# Patient Record
Sex: Female | Born: 1975 | Hispanic: Yes | Smoking: Never smoker
Health system: Southern US, Community
[De-identification: ages and names within clinical notes are randomized; demographics above are authoritative.]

## PROBLEM LIST (undated history)

## (undated) DIAGNOSIS — Z9882 Breast implant status: Secondary | ICD-10-CM

## (undated) DIAGNOSIS — D219 Benign neoplasm of connective and other soft tissue, unspecified: Secondary | ICD-10-CM

## (undated) DIAGNOSIS — K2970 Gastritis, unspecified, without bleeding: Secondary | ICD-10-CM

## (undated) DIAGNOSIS — J45909 Unspecified asthma, uncomplicated: Secondary | ICD-10-CM

## (undated) DIAGNOSIS — G43909 Migraine, unspecified, not intractable, without status migrainosus: Secondary | ICD-10-CM

## (undated) HISTORY — DX: Unspecified asthma, uncomplicated: J45.909

---

## 2010-01-16 HISTORY — PX: VAGINOPLASTY: SHX329

## 2012-01-17 DIAGNOSIS — J45909 Unspecified asthma, uncomplicated: Secondary | ICD-10-CM

## 2012-01-17 HISTORY — DX: Unspecified asthma, uncomplicated: J45.909

## 2014-12-24 ENCOUNTER — Other Ambulatory Visit: Payer: Self-pay

## 2014-12-24 DIAGNOSIS — Z1231 Encounter for screening mammogram for malignant neoplasm of breast: Secondary | ICD-10-CM

## 2015-01-15 ENCOUNTER — Ambulatory Visit: Payer: Self-pay

## 2015-01-20 ENCOUNTER — Ambulatory Visit: Payer: Self-pay

## 2015-01-25 ENCOUNTER — Ambulatory Visit: Payer: Self-pay

## 2015-10-13 ENCOUNTER — Telehealth: Payer: Self-pay | Admitting: *Deleted

## 2015-10-13 NOTE — Telephone Encounter (Signed)
Generic Allese called to [phone].336 [phone].Patient has an appointment to be seen on 11/12/15.

## 2015-11-10 ENCOUNTER — Ambulatory Visit (INDEPENDENT_AMBULATORY_CARE_PROVIDER_SITE_OTHER): Payer: Medicaid Other | Admitting: Obstetrics and Gynecology

## 2015-11-10 ENCOUNTER — Encounter: Payer: Self-pay | Admitting: Obstetrics and Gynecology

## 2015-11-10 DIAGNOSIS — Z30014 Encounter for initial prescription of intrauterine contraceptive device: Secondary | ICD-10-CM | POA: Diagnosis not present

## 2015-11-10 DIAGNOSIS — Z3202 Encounter for pregnancy test, result negative: Secondary | ICD-10-CM | POA: Diagnosis not present

## 2015-11-10 LAB — POCT URINE PREGNANCY: Preg Test, Ur: NEGATIVE

## 2015-11-10 MED ORDER — LEVONORGESTREL 20 MCG/24HR IU IUD
INTRAUTERINE_SYSTEM | Freq: Once | INTRAUTERINE | Status: AC
  Administered 2015-11-10: 15:00:00 via INTRAUTERINE

## 2015-11-10 NOTE — Addendum Note (Signed)
Addended by: Tristan Schroeder D on: 11/10/2015 04:54 PM   Modules accepted: Orders

## 2015-11-10 NOTE — Patient Instructions (Signed)
Levonorgestrel intrauterine device (IUD) What is this medicine? LEVONORGESTREL IUD (LEE voe nor jes trel) is a contraceptive (birth control) device. The device is placed inside the uterus by a healthcare professional. It is used to prevent pregnancy and can also be used to treat heavy bleeding that occurs during your period. Depending on the device, it can be used for 3 to 5 years. This medicine may be used for other purposes; ask your health care provider or pharmacist if you have questions. What should I tell my health care provider before I take this medicine? They need to know if you have any of these conditions: -abnormal Pap smear -cancer of the breast, uterus, or cervix -diabetes -endometritis -genital or pelvic infection now or in the past -have more than one sexual partner or your partner has more than one partner -heart disease -history of an ectopic or tubal pregnancy -immune system problems -IUD in place -liver disease or tumor -problems with blood clots or take blood-thinners -use intravenous drugs -uterus of unusual shape -vaginal bleeding that has not been explained -an unusual or allergic reaction to levonorgestrel, other hormones, silicone, or polyethylene, medicines, foods, dyes, or preservatives -pregnant or trying to get pregnant -breast-feeding How should I use this medicine? This device is placed inside the uterus by a health care professional. Talk to your pediatrician regarding the use of this medicine in children. Special care may be needed. Overdosage: If you think you have taken too much of this medicine contact a poison control center or emergency room at once. NOTE: This medicine is only for you. Do not share this medicine with others. What if I miss a dose? This does not apply. What may interact with this medicine? Do not take this medicine with any of the following medications: -amprenavir -bosentan -fosamprenavir This medicine may also interact with  the following medications: -aprepitant -barbiturate medicines for inducing sleep or treating seizures -bexarotene -griseofulvin -medicines to treat seizures like carbamazepine, ethotoin, felbamate, oxcarbazepine, phenytoin, topiramate -modafinil -pioglitazone -rifabutin -rifampin -rifapentine -some medicines to treat HIV infection like atazanavir, indinavir, lopinavir, nelfinavir, tipranavir, ritonavir -St. John's wort -warfarin This list may not describe all possible interactions. Give your health care provider a list of all the medicines, herbs, non-prescription drugs, or dietary supplements you use. Also tell them if you smoke, drink alcohol, or use illegal drugs. Some items may interact with your medicine. What should I watch for while using this medicine? Visit your doctor or health care professional for regular check ups. See your doctor if you or your partner has sexual contact with others, becomes HIV positive, or gets a sexual transmitted disease. This product does not protect you against HIV infection (AIDS) or other sexually transmitted diseases. You can check the placement of the IUD yourself by reaching up to the top of your vagina with clean fingers to feel the threads. Do not pull on the threads. It is a good habit to check placement after each menstrual period. Call your doctor right away if you feel more of the IUD than just the threads or if you cannot feel the threads at all. The IUD may come out by itself. You may become pregnant if the device comes out. If you notice that the IUD has come out use a backup birth control method like condoms and call your health care provider. Using tampons will not change the position of the IUD and are okay to use during your period. What side effects may I notice from receiving this medicine?   Side effects that you should report to your doctor or health care professional as soon as possible: -allergic reactions like skin rash, itching or  hives, swelling of the face, lips, or tongue -fever, flu-like symptoms -genital sores -high blood pressure -no menstrual period for 6 weeks during use -pain, swelling, warmth in the leg -pelvic pain or tenderness -severe or sudden headache -signs of pregnancy -stomach cramping -sudden shortness of breath -trouble with balance, talking, or walking -unusual vaginal bleeding, discharge -yellowing of the eyes or skin Side effects that usually do not require medical attention (report to your doctor or health care professional if they continue or are bothersome): -acne -breast pain -change in sex drive or performance -changes in weight -cramping, dizziness, or faintness while the device is being inserted -headache -irregular menstrual bleeding within first 3 to 6 months of use -nausea This list may not describe all possible side effects. Call your doctor for medical advice about side effects. You may report side effects to FDA at 1-800-FDA-1088. Where should I keep my medicine? This does not apply. NOTE: This sheet is a summary. It may not cover all possible information. If you have questions about this medicine, talk to your doctor, pharmacist, or health care provider.    2016, Elsevier/Gold Standard. (2011-02-02 13:54:04) IUD PLACEMENT POST-PROCEDURE INSTRUCTIONS  1. You may take Ibuprofen, Aleve or Tylenol for pain if needed.  Cramping should resolve within in 24 hours.  2. You may have a small amount of spotting.  You should wear a mini pad for the next few days.  3. You may have intercourse after 24 hours.  If you using this for birth control, it is effective immediately.  4. You need to call if you have any pelvic pain, fever, heavy bleeding or foul smelling vaginal discharge.  Irregular bleeding is common the first several months after having an IUD placed. You do not need to call for this reason unless you are concerned.  5. Shower or bathe as normal  6. You should have a  follow-up appointment in 4-8 weeks for a re-check to make sure you are not having any problems.

## 2015-11-10 NOTE — Progress Notes (Addendum)
Pt here for IUD insertion   IUD Insertion Procedure Note  Pre-operative Diagnosis: Contraception  Post-operative Diagnosis: same  Indications: contraception  Procedure Details  Urine pregnancy test was done today and result was negative.  The risks (including infection, bleeding, pain, and uterine perforation) and benefits of the procedure were explained to the patient and written and verbal informed consent was obtained.    Cervix cleansed with Betadine. Uterus sounded to 8 cm. IUD inserted without difficulty. String visible and trimmed. 3 cm form cervix. Patient tolerated procedure well.  IUD Information: Mirena.  Condition: Stable  Complications: None  Plan:  The patient was advised to call for any fever or for prolonged or severe pain or bleeding. She was advised to use OTC analgesics as needed for mild to moderate pain.

## 2015-11-22 ENCOUNTER — Encounter: Payer: Self-pay | Admitting: Obstetrics and Gynecology

## 2015-11-29 ENCOUNTER — Emergency Department (HOSPITAL_COMMUNITY)
Admission: EM | Admit: 2015-11-29 | Discharge: 2015-11-29 | Disposition: A | Discharge status: Home / Self Care | Payer: Medicaid Other | Attending: Emergency Medicine | Admitting: Emergency Medicine

## 2015-11-29 ENCOUNTER — Encounter (HOSPITAL_COMMUNITY): Payer: Self-pay | Admitting: Emergency Medicine

## 2015-11-29 ENCOUNTER — Emergency Department (HOSPITAL_COMMUNITY): Payer: Medicaid Other

## 2015-11-29 DIAGNOSIS — Z79899 Other long term (current) drug therapy: Secondary | ICD-10-CM | POA: Diagnosis not present

## 2015-11-29 DIAGNOSIS — N83201 Unspecified ovarian cyst, right side: Secondary | ICD-10-CM | POA: Diagnosis not present

## 2015-11-29 DIAGNOSIS — R102 Pelvic and perineal pain: Secondary | ICD-10-CM

## 2015-11-29 DIAGNOSIS — J45909 Unspecified asthma, uncomplicated: Secondary | ICD-10-CM | POA: Insufficient documentation

## 2015-11-29 DIAGNOSIS — T8332XA Displacement of intrauterine contraceptive device, initial encounter: Secondary | ICD-10-CM

## 2015-11-29 DIAGNOSIS — R1030 Lower abdominal pain, unspecified: Secondary | ICD-10-CM | POA: Diagnosis present

## 2015-11-29 DIAGNOSIS — T8389XA Other specified complication of genitourinary prosthetic devices, implants and grafts, initial encounter: Secondary | ICD-10-CM

## 2015-11-29 LAB — CBC WITH DIFFERENTIAL/PLATELET
BASOS ABS: 0 10*3/uL (ref 0.0–0.1)
Basophils Relative: 1 %
Eosinophils Absolute: 0.1 10*3/uL (ref 0.0–0.7)
Eosinophils Relative: 1 %
HEMATOCRIT: 39.6 % (ref 36.0–46.0)
HEMOGLOBIN: 13.3 g/dL (ref 12.0–15.0)
LYMPHS PCT: 30 %
Lymphs Abs: 2.3 10*3/uL (ref 0.7–4.0)
MCH: 28.5 pg (ref 26.0–34.0)
MCHC: 33.6 g/dL (ref 30.0–36.0)
MCV: 84.8 fL (ref 78.0–100.0)
Monocytes Absolute: 0.4 10*3/uL (ref 0.1–1.0)
Monocytes Relative: 5 %
NEUTROS ABS: 4.9 10*3/uL (ref 1.7–7.7)
NEUTROS PCT: 63 %
Platelets: 195 10*3/uL (ref 150–400)
RBC: 4.67 MIL/uL (ref 3.87–5.11)
RDW: 12.5 % (ref 11.5–15.5)
WBC: 7.6 10*3/uL (ref 4.0–10.5)

## 2015-11-29 LAB — URINALYSIS, ROUTINE W REFLEX MICROSCOPIC
Bilirubin Urine: NEGATIVE
Glucose, UA: NEGATIVE mg/dL
Hgb urine dipstick: NEGATIVE
Ketones, ur: NEGATIVE mg/dL
LEUKOCYTES UA: NEGATIVE
NITRITE: NEGATIVE
PH: 7.5 (ref 5.0–8.0)
Protein, ur: NEGATIVE mg/dL
SPECIFIC GRAVITY, URINE: 1.007 (ref 1.005–1.030)

## 2015-11-29 LAB — COMPREHENSIVE METABOLIC PANEL
ALT: 15 U/L (ref 14–54)
AST: 18 U/L (ref 15–41)
Albumin: 3.7 g/dL (ref 3.5–5.0)
Alkaline Phosphatase: 45 U/L (ref 38–126)
Anion gap: 6 (ref 5–15)
BUN: 8 mg/dL (ref 6–20)
CHLORIDE: 103 mmol/L (ref 101–111)
CO2: 27 mmol/L (ref 22–32)
CREATININE: 0.82 mg/dL (ref 0.44–1.00)
Calcium: 9 mg/dL (ref 8.9–10.3)
GFR calc non Af Amer: >60 mL/min (ref >60)
Glucose, Bld: 94 mg/dL (ref 65–99)
POTASSIUM: 4.1 mmol/L (ref 3.5–5.1)
SODIUM: 136 mmol/L (ref 135–145)
Total Bilirubin: 0.6 mg/dL (ref 0.3–1.2)
Total Protein: 6.5 g/dL (ref 6.5–8.1)

## 2015-11-29 LAB — WET PREP, GENITAL
CLUE CELLS WET PREP: NONE SEEN
SPERM: NONE SEEN
TRICH WET PREP: NONE SEEN
YEAST WET PREP: NONE SEEN

## 2015-11-29 LAB — I-STAT BETA HCG BLOOD, ED (MC, WL, AP ONLY): I-stat hCG, quantitative: <5 m[IU]/mL (ref <5)

## 2015-11-29 LAB — LIPASE, BLOOD: Lipase: 30 U/L (ref 11–51)

## 2015-11-29 MED ORDER — ONDANSETRON HCL 4 MG/2ML IJ SOLN
4.0000 mg | Freq: Once | INTRAMUSCULAR | Status: DC
  Filled 2015-11-29: qty 2

## 2015-11-29 MED ORDER — MORPHINE SULFATE (PF) 4 MG/ML IV SOLN
4.0000 mg | Freq: Once | INTRAVENOUS | Status: AC
  Administered 2015-11-29: 4 mg via INTRAVENOUS
  Filled 2015-11-29: qty 1

## 2015-11-29 MED ORDER — HYDROCODONE-ACETAMINOPHEN 5-325 MG PO TABS: ORAL_TABLET | ORAL | 0 refills | Status: DC

## 2015-11-29 NOTE — ED Triage Notes (Signed)
Pt sts lower abd pain that woke here from sleep; pt sts had IUD placed 1 month ago

## 2015-11-29 NOTE — ED Provider Notes (Signed)
**Note Estrada-Identified via Obfuscation** [facility] DEPT Provider Note   CSN: [id number] Arrival date & time: 11/29/15  1018     History   Chief Complaint Chief Complaint  Patient presents with  . Abdominal Pain    HPI  Blood pressure 117/86, pulse 86, temperature 98.1 F (36.7 C), temperature source Oral, resp. rate 20, height 5\' 5"  (1.651 m), weight 55.3 kg, last menstrual period 11/10/2015, SpO2 100 %.  Catherine Estrada is a 40 y.o. female complaining of acute onset of midline lower abdominal pain at 9 AM, woke her from sleep, 10 out of 10, she took 2 acetaminophen with little relief, associated nausea with no vomiting, dysuria, hematuria, change in vaginal discharge or defecation. She had IUD placed on October 25 at [facility], has [facility] appointment on November 20.  Past Medical History:  Diagnosis Date  . Asthma 2014    Patient Active Problem List   Diagnosis Date Noted  . Encounter for initial prescription of intrauterine contraceptive device (IUD) 11/10/2015    History reviewed. No pertinent surgical history.  OB History    No data available       Home Medications    Prior to Admission medications   Medication Sig Start Date End Date Taking? Authorizing Provider  albuterol (PROVENTIL HFA;VENTOLIN HFA) 108 (90 Base) MCG/ACT inhaler Inhale 2 puffs into the lungs every 6 (six) hours as needed for wheezing or shortness of breath.   Yes Historical Provider, MD  HYDROcodone-acetaminophen (NORCO/VICODIN) 5-325 MG tablet Take 1-2 tablets by mouth every 6 hours as needed for pain and/or cough. 11/29/15   Davon Folta, PA-C  Levonorgestrel-Ethinyl Estrad (ALESSE, 28, PO) Take by mouth.    Historical Provider, MD    Family History Family History  Problem Relation Age of Onset  . Breast cancer Paternal Aunt     Social History Social History  Substance Use Topics  . Smoking status: Never Smoker  . Smokeless tobacco: Never Used  . Alcohol use No     Allergies   Patient has no known  allergies.   Review of Systems Review of Systems  10 systems reviewed and found to be negative, except as noted in the HPI.   Physical Exam Updated Vital Signs BP 109/79 (BP Location: Right Arm)   Pulse 85   Temp 98 F (36.7 C) (Oral)   Resp 16   Ht 5\' 5"  (1.651 m)   Wt 55.3 kg   LMP 11/10/2015 (Exact Date)   SpO2 100%   BMI 20.30 kg/m   Physical Exam  Constitutional: She is oriented to person, place, and time. She appears well-developed and well-nourished. No distress.  HENT:  Head: Normocephalic and atraumatic.  Mouth/Throat: Oropharynx is clear and moist.  Eyes: Conjunctivae and EOM are normal. Pupils are equal, round, and reactive to light.  Neck: Normal range of motion.  Cardiovascular: Normal rate, regular rhythm and intact distal pulses.   Pulmonary/Chest: Effort normal and breath sounds normal.  Abdominal: Soft. There is no tenderness.  Genitourinary:  Genitourinary Comments: Pelvic exam is chaperoned by nurse: No rashes or lesions, scant, white, non-foul-smelling vaginal discharge. Cervix with no strings visualized, no cervical motion or adnexal tenderness.  Musculoskeletal: Normal range of motion.  Neurological: She is alert and oriented to person, place, and time.  Skin: She is not diaphoretic.  Psychiatric: She has a normal mood and affect.  Nursing note and vitals reviewed.    ED Treatments / Results  Labs (all labs ordered are listed, but only abnormal results are displayed)  Labs Reviewed  WET PREP, GENITAL - Abnormal; Notable for the following:       Result Value   WBC, Wet Prep HPF POC MANY (*)    All other components within normal limits  COMPREHENSIVE METABOLIC PANEL  CBC WITH DIFFERENTIAL/PLATELET  LIPASE, BLOOD  URINALYSIS, ROUTINE W REFLEX MICROSCOPIC (NOT AT [facility])  I-STAT BETA HCG BLOOD, ED ([facility], [facility], [facility] ONLY)  GC/CHLAMYDIA PROBE AMP ([place]) NOT AT [facility]    EKG  EKG Interpretation None       Radiology US Transvaginal  Non-ob  Result Date: 11/29/2015 CLINICAL DATA:  40 year old female with severe pelvic pain and nausea since this morning. Query IUD migration. Initial encounter. LMP 11/10/2015. EXAM: TRANSABDOMINAL AND TRANSVAGINAL ULTRASOUND OF PELVIS TECHNIQUE: Both transabdominal and transvaginal ultrasound examinations of the pelvis were performed. Transabdominal technique was performed for global imaging of the pelvis including uterus, ovaries, adnexal regions, and pelvic cul-Estrada-sac. It was necessary to proceed with endovaginal exam following the transabdominal exam to visualize the endometrium. COMPARISON:  None FINDINGS: Uterus Measurements: 10.9 x 5.2 x 6.8 cm. One 1.9 cm intramural fibroid at the uterine fundus is noted (image 60), and a second 2.5 cm intramural fibroid is noted along the dorsal body (image 8). There are two larger pedunculated sub serosal fibroids measuring 3.0-3.3 cm are noted about each cornu. See images 111 and 113. Endometrium Thickness: 6 mm. The IUD is visible at the level of the lower uterine segment extending to the cervix (image 144). Right ovary Measurements: 4.1 x 2.4 x 2.8 cm. Thick walled cyst measuring up to 3.8 cm with no central vascularity (images 133 and 137). Otherwise normal. Left ovary Measurements: 2.5 x 1.4 x 1.9 cm. Dominant cyst or follicle (image [id number]). Normal appearance/no adnexal mass. Other findings Small volume simple appearing pelvic free fluid (image 102). IMPRESSION: 1. The IUD appears positioned too low, in the lower uterine segment extending to the cervix. 2. There is a thick walled cystic lesion of the right ovary measuring 3.8 cm which has indeterminate but probably benign characteristics. Burtis Junes this is physiologic but is not classic for hemorrhagic cyst or corpus luteum. Repeat pelvis ultrasound in 6-12 weeks to ensure resolution. 3. Two 3-3.5 cm sub serosal, pedunculated fibroids at the uterine fundus. Two smaller intramural fibroids. 4. Small volume pelvic free  fluid. The above Recommendation follows the consensus statement: Management of Asymptomatic Ovarian and Other Adnexal Cysts Imaged at [place]: Society of Radiologists in [place]. Radiology 2010; [phone]. Electronically Signed   By: Genevie Ann M.D.   On: 11/29/2015 16:02   US Pelvis Complete  Result Date: 11/29/2015 CLINICAL DATA:  40 year old female with severe pelvic pain and nausea since this morning. Query IUD migration. Initial encounter. LMP 11/10/2015. EXAM: TRANSABDOMINAL AND TRANSVAGINAL ULTRASOUND OF PELVIS TECHNIQUE: Both transabdominal and transvaginal ultrasound examinations of the pelvis were performed. Transabdominal technique was performed for global imaging of the pelvis including uterus, ovaries, adnexal regions, and pelvic cul-Estrada-sac. It was necessary to proceed with endovaginal exam following the transabdominal exam to visualize the endometrium. COMPARISON:  None FINDINGS: Uterus Measurements: 10.9 x 5.2 x 6.8 cm. One 1.9 cm intramural fibroid at the uterine fundus is noted (image 60), and a second 2.5 cm intramural fibroid is noted along the dorsal body (image 8). There are two larger pedunculated sub serosal fibroids measuring 3.0-3.3 cm are noted about each cornu. See images 111 and 113. Endometrium Thickness: 6 mm. The IUD is visible at the level of the lower  uterine segment extending to the cervix (image 144). Right ovary Measurements: 4.1 x 2.4 x 2.8 cm. Thick walled cyst measuring up to 3.8 cm with no central vascularity (images 133 and 137). Otherwise normal. Left ovary Measurements: 2.5 x 1.4 x 1.9 cm. Dominant cyst or follicle (image [id number]). Normal appearance/no adnexal mass. Other findings Small volume simple appearing pelvic free fluid (image 102). IMPRESSION: 1. The IUD appears positioned too low, in the lower uterine segment extending to the cervix. 2. There is a thick walled cystic lesion of the right ovary measuring 3.8 cm which has indeterminate  but probably benign characteristics. Burtis Junes this is physiologic but is not classic for hemorrhagic cyst or corpus luteum. Repeat pelvis ultrasound in 6-12 weeks to ensure resolution. 3. Two 3-3.5 cm sub serosal, pedunculated fibroids at the uterine fundus. Two smaller intramural fibroids. 4. Small volume pelvic free fluid. The above Recommendation follows the consensus statement: Management of Asymptomatic Ovarian and Other Adnexal Cysts Imaged at [place]: Society of Radiologists in [place]. Radiology 2010; [phone]. Electronically Signed   By: Genevie Ann M.D.   On: 11/29/2015 16:02    Procedures Procedures (including critical care time)  Medications Ordered in ED Medications  morphine 4 MG/ML injection 4 mg (4 mg Intravenous Given 11/29/15 1113)     Initial Impression / Assessment and Plan / ED Course  I have reviewed the triage vital signs and the nursing notes.  Pertinent labs & imaging results that were available during my care of the patient were reviewed by me and considered in my medical decision making (see chart for details).  Clinical Course     Vitals:   11/29/15 1100 11/29/15 1315 11/29/15 1415 11/29/15 1616  BP: 105/78 92/69 120/85 109/79  Pulse: 91 95 97 85  Resp:    16  Temp:    98 F (36.7 C)  TempSrc:    Oral  SpO2: 100% 99% 100% 100%  Weight:      Height:        Medications  morphine 4 MG/ML injection 4 mg (4 mg Intravenous Given 11/29/15 1113)    Chauntell Credit is 40 y.o. female presenting with Suprapubic abdominal pain, severe and spontaneously improving. Pelvic exam with no acute abnormalities, ultrasound with slightly abnormally placed IUD. She has a 3.8 cm right sided ovarian cysts, her pain was not in the right side of the abdomen. I doubt this is a torsion. Patient will be sent home with pain medication and advised to follow closely with OB/GYN.  Discussed case with attending physician who agrees with care plan and  disposition.   Evaluation does not show pathology that would require ongoing emergent intervention or inpatient treatment. Pt is hemodynamically stable and mentating appropriately. Discussed findings and plan with patient/guardian, who agrees with care plan. All questions answered. Return precautions discussed and outpatient follow up given.     Final Clinical Impressions(s) / ED Diagnoses   Final diagnoses:  Cyst of right ovary  Pelvic pain    New Prescriptions Discharge Medication List as of 11/29/2015  4:17 PM    START taking these medications   Details  HYDROcodone-acetaminophen (NORCO/VICODIN) 5-325 MG tablet Take 1-2 tablets by mouth every 6 hours as needed for pain and/or cough., Print         Monico Blitz, PA-C 11/30/15 [id number]    Merrily Pew, MD 12/02/15 1719

## 2015-11-29 NOTE — Discharge Instructions (Signed)
Take vicodin for breakthrough pain, do not drink alcohol, drive, care for children or do other critical tasks while taking vicodin.

## 2015-11-30 LAB — GC/CHLAMYDIA PROBE AMP (~~LOC~~) NOT AT ARMC
Chlamydia: NEGATIVE
Neisseria Gonorrhea: NEGATIVE

## 2015-12-02 ENCOUNTER — Telehealth: Payer: Self-pay | Admitting: *Deleted

## 2015-12-02 ENCOUNTER — Ambulatory Visit: Payer: Medicaid Other | Admitting: Obstetrics & Gynecology

## 2015-12-02 ENCOUNTER — Ambulatory Visit (INDEPENDENT_AMBULATORY_CARE_PROVIDER_SITE_OTHER): Payer: Medicaid Other | Admitting: Obstetrics & Gynecology

## 2015-12-02 ENCOUNTER — Encounter: Payer: Self-pay | Admitting: Obstetrics & Gynecology

## 2015-12-02 VITALS — BP 128/86 | HR 94 | Temp 98.4°F | Ht 66.0 in | Wt 125.2 lb

## 2015-12-02 DIAGNOSIS — T8332XA Displacement of intrauterine contraceptive device, initial encounter: Secondary | ICD-10-CM

## 2015-12-02 DIAGNOSIS — N83201 Unspecified ovarian cyst, right side: Secondary | ICD-10-CM

## 2015-12-02 DIAGNOSIS — Z30011 Encounter for initial prescription of contraceptive pills: Secondary | ICD-10-CM | POA: Diagnosis not present

## 2015-12-02 DIAGNOSIS — Z30432 Encounter for removal of intrauterine contraceptive device: Secondary | ICD-10-CM

## 2015-12-02 DIAGNOSIS — T8339XA Other mechanical complication of intrauterine contraceptive device, initial encounter: Secondary | ICD-10-CM | POA: Insufficient documentation

## 2015-12-02 DIAGNOSIS — Z3042 Encounter for surveillance of injectable contraceptive: Secondary | ICD-10-CM

## 2015-12-02 MED ORDER — LEVONORGESTREL-ETHINYL ESTRAD 0.1-20 MG-MCG PO TABS: 1.0000 | ORAL_TABLET | Freq: Every day | ORAL | 11 refills | Status: DC

## 2015-12-02 NOTE — Telephone Encounter (Signed)
Patient called and offered appointment today .

## 2015-12-02 NOTE — Progress Notes (Signed)
Patient ID: Catherine Estrada, female   DOB: 01/01/1976, 40 y.o.   MRN: [id number]  Chief Complaint  Patient presents with  . Gynecologic Exam    Patient is in the office for GYN visit.   Had IUD place 10/25 and [place] 11/13 showed that it was in the cervix HPI Catherine Estrada is a 40 y.o. female.  No obstetric history on file. Patient's last menstrual period was 11/10/2015. Pelvic pain 3 days ago and [place] was done in ED. Pain is resolving but she requests removal of her IUD and return to [facility] use HPI    Past Medical History:  Diagnosis Date  . Asthma 2014    No past surgical history on file.  Family History  Problem Relation Age of Onset  . Breast cancer Paternal Aunt     Social History Social History  Substance Use Topics  . Smoking status: Never Smoker  . Smokeless tobacco: Never Used  . Alcohol use No    No Known Allergies  Current Outpatient Prescriptions  Medication Sig Dispense Refill  . HYDROcodone-acetaminophen (NORCO/VICODIN) 5-325 MG tablet Take 1-2 tablets by mouth every 6 hours as needed for pain and/or cough. 7 tablet 0  . albuterol (PROVENTIL HFA;VENTOLIN HFA) 108 (90 Base) MCG/ACT inhaler Inhale 2 puffs into the lungs every 6 (six) hours as needed for wheezing or shortness of breath.    . Levonorgestrel-Ethinyl Estrad (ALESSE, 28, PO) Take by mouth.     No current facility-administered medications for this visit.     Review of Systems Review of Systems  Constitutional: Negative.   Gastrointestinal: Negative.   Genitourinary: Positive for pelvic pain. Negative for vaginal bleeding and vaginal discharge.    Blood pressure 128/86, pulse 94, temperature 98.4 F (36.9 C), temperature source Oral, height 5\' 6"  (1.676 m), weight 125 lb 3.2 oz (56.8 kg), last menstrual period 11/10/2015.  Physical Exam Physical Exam  Constitutional: She appears well-developed. No distress.  Pulmonary/Chest: Effort normal.  Genitourinary: Vagina normal. No vaginal discharge found.   Genitourinary Comments: String not seen but easily grasped with dressing forceps and removed intact, low cervical placement was confirmed  Neurological: She is alert.  Psychiatric: She has a normal mood and affect. Her behavior is normal.    Data Reviewed CLINICAL DATA:  40 year old female with severe pelvic pain and nausea since this morning. Query IUD migration. Initial encounter. LMP 11/10/2015.  EXAM: TRANSABDOMINAL AND TRANSVAGINAL ULTRASOUND OF PELVIS  TECHNIQUE: Both transabdominal and transvaginal ultrasound examinations of the pelvis were performed. Transabdominal technique was performed for global imaging of the pelvis including uterus, ovaries, adnexal regions, and pelvic cul-de-sac. It was necessary to proceed with endovaginal exam following the transabdominal exam to visualize the endometrium.  COMPARISON:  None  FINDINGS: Uterus  Measurements: 10.9 x 5.2 x 6.8 cm. One 1.9 cm intramural fibroid at the uterine fundus is noted (image 60), and a second 2.5 cm intramural fibroid is noted along the dorsal body (image 8). There are two larger pedunculated sub serosal fibroids measuring 3.0-3.3 cm are noted about each cornu. See images 111 and 113.  Endometrium  Thickness: 6 mm. The IUD is visible at the level of the lower uterine segment extending to the cervix (image 144).  Right ovary  Measurements: 4.1 x 2.4 x 2.8 cm. Thick walled cyst measuring up to 3.8 cm with no central vascularity (images 133 and 137). Otherwise normal.  Left ovary  Measurements: 2.5 x 1.4 x 1.9 cm. Dominant cyst or follicle (image [id number]). Normal appearance/no  adnexal mass.  Other findings  Small volume simple appearing pelvic free fluid (image 102).  IMPRESSION: 1. The IUD appears positioned too low, in the lower uterine segment extending to the cervix. 2. There is a thick walled cystic lesion of the right ovary measuring 3.8 cm which has indeterminate but probably  benign characteristics. Burtis Junes this is physiologic but is not classic for hemorrhagic cyst or corpus luteum. Repeat pelvis ultrasound in 6-12 weeks to ensure resolution. 3. Two 3-3.5 cm sub serosal, pedunculated fibroids at the uterine fundus. Two smaller intramural fibroids. 4. Small volume pelvic free fluid.  The above Recommendation follows the consensus statement: Management of Asymptomatic Ovarian and Other Adnexal Cysts Imaged at [place]: Society of Radiologists in [place]. Radiology 2010; [phone].   Electronically Signed   By: Genevie Ann M.D.   On: 11/29/2015 16:02  Assessment   Malposition of IUD, removed intact Suspect right ovarian cyst on [place], will f/u in 6 w Plan   [place] 6 weeks Refill Rx OcP   Avonna Iribe 12/02/2015, 4:20 PM

## 2015-12-02 NOTE — Telephone Encounter (Signed)
-----   Message from Chancy Milroy, MD sent at 12/01/2015  1:23 PM EST ----- Regarding: FW: recent visit to ED Is there appt available for her to be seen this week? Legrand Como  ----- Message ----- From: Valene Bors, CMA Sent: 12/01/2015  10:52 AM To: Chancy Milroy, MD Subject: recent visit to ED                             Dr Rip Harbour,  This is the patient I reviewed u/s result with you in office on 11/29/13, asking if pt needed a urgent appt as she was told.  Pt was advised that she may keep appt as scheduled for Monday but states that this is a urgent matter and that she is in severe pain due to ovarian cyst. Pt states that she was given Vicoden at ED visit and ? Should she be taking it for a week until her appt.  Pt states that she hopes nothing happens between now and Monday- states ? Lawsuit if so.  Pt was advised that you reviewed her u/s results and did not note anything that was of urgent need that would require sooner appt. Pt was made aware her IUD is in her lower uterine segment but should not be problematic until her next appt.   Please advise on any other recommendation for pt or change in appt.  Thanks.

## 2015-12-02 NOTE — Progress Notes (Signed)
Patient is in the office for follow up after previous IUD insertion.

## 2015-12-06 ENCOUNTER — Ambulatory Visit: Payer: Medicaid Other | Admitting: Obstetrics and Gynecology

## 2016-01-04 ENCOUNTER — Ambulatory Visit (INDEPENDENT_AMBULATORY_CARE_PROVIDER_SITE_OTHER): Payer: Medicaid Other

## 2016-01-04 ENCOUNTER — Encounter: Payer: Self-pay | Admitting: *Deleted

## 2016-01-04 ENCOUNTER — Encounter: Payer: Self-pay | Admitting: Obstetrics and Gynecology

## 2016-01-04 ENCOUNTER — Ambulatory Visit (INDEPENDENT_AMBULATORY_CARE_PROVIDER_SITE_OTHER): Payer: Medicaid Other | Admitting: Obstetrics and Gynecology

## 2016-01-04 ENCOUNTER — Ambulatory Visit: Payer: Medicaid Other

## 2016-01-04 DIAGNOSIS — D259 Leiomyoma of uterus, unspecified: Secondary | ICD-10-CM

## 2016-01-04 DIAGNOSIS — D219 Benign neoplasm of connective and other soft tissue, unspecified: Secondary | ICD-10-CM | POA: Insufficient documentation

## 2016-01-04 DIAGNOSIS — N83201 Unspecified ovarian cyst, right side: Secondary | ICD-10-CM | POA: Diagnosis not present

## 2016-01-04 NOTE — Progress Notes (Signed)
Pt here for follow up GYN U/S d/t right ovarian cyst.  Pt is doing well with OCP's U/S reveals several fibroids as noted before. Right ovary with follicles and normal left ovary Results reviewed with pt  PE Not Performed  A/P Uterine fibroids        Contraception  Uterine fibroids reviewed with pt. Management options reviewed. Pt desires to follow at this time. Will continue with OCP's. F/U with yearly GYN  Exam or PRN

## 2016-11-16 ENCOUNTER — Other Ambulatory Visit (HOSPITAL_COMMUNITY)
Admission: RE | Admit: 2016-11-16 | Discharge: 2016-11-16 | Disposition: A | Discharge status: Home / Self Care | Payer: Medicaid Other | Source: Ambulatory Visit | Attending: Obstetrics and Gynecology | Admitting: Obstetrics and Gynecology

## 2016-11-16 ENCOUNTER — Other Ambulatory Visit: Payer: Self-pay | Admitting: Obstetrics and Gynecology

## 2016-11-16 ENCOUNTER — Ambulatory Visit (INDEPENDENT_AMBULATORY_CARE_PROVIDER_SITE_OTHER): Payer: Medicaid Other | Admitting: Obstetrics and Gynecology

## 2016-11-16 ENCOUNTER — Encounter: Payer: Self-pay | Admitting: Obstetrics and Gynecology

## 2016-11-16 VITALS — BP 116/78 | HR 80 | Ht 66.0 in | Wt 128.5 lb

## 2016-11-16 DIAGNOSIS — D219 Benign neoplasm of connective and other soft tissue, unspecified: Secondary | ICD-10-CM

## 2016-11-16 DIAGNOSIS — Z1231 Encounter for screening mammogram for malignant neoplasm of breast: Secondary | ICD-10-CM

## 2016-11-16 DIAGNOSIS — Z113 Encounter for screening for infections with a predominantly sexual mode of transmission: Secondary | ICD-10-CM | POA: Insufficient documentation

## 2016-11-16 DIAGNOSIS — T8332XA Displacement of intrauterine contraceptive device, initial encounter: Secondary | ICD-10-CM

## 2016-11-16 DIAGNOSIS — Z0000 Encounter for general adult medical examination without abnormal findings: Secondary | ICD-10-CM

## 2016-11-16 DIAGNOSIS — R350 Frequency of micturition: Secondary | ICD-10-CM

## 2016-11-16 DIAGNOSIS — Z01419 Encounter for gynecological examination (general) (routine) without abnormal findings: Secondary | ICD-10-CM | POA: Insufficient documentation

## 2016-11-16 DIAGNOSIS — Z3009 Encounter for other general counseling and advice on contraception: Secondary | ICD-10-CM | POA: Insufficient documentation

## 2016-11-16 DIAGNOSIS — R102 Pelvic and perineal pain: Secondary | ICD-10-CM

## 2016-11-16 MED ORDER — LEVONORGESTREL-ETHINYL ESTRAD 0.1-20 MG-MCG PO TABS: 1.0000 | ORAL_TABLET | Freq: Every day | ORAL | 11 refills | Status: DC

## 2016-11-16 NOTE — Progress Notes (Signed)
Pt would like referral for mammogram. Pt would like STD screening at today's visit.

## 2016-11-16 NOTE — Progress Notes (Signed)
GYNECOLOGY ANNUAL PREVENTATIVE CARE ENCOUNTER NOTE  Subjective:   Catherine Estrada is a 41 y.o. [id number] female here for a annual gynecologic exam. Current complaints: pelvic pain, particularly in front on occasion, denies pressure, dyspareunia.   Denies abnormal vaginal bleeding, discharge, pelvic pain, problems with intercourse or other gynecologic concerns. Does report periods are heavy, she bleeds monthly for 4 days, reports first two days are heavy. On OCPs and happy with them.   Gynecologic History Patient's last menstrual period was 11/01/2016. Contraception: OCP (estrogen/progesterone) Last Pap: 2 years ago, per patient was normal Last mammogram: n/a - to start this year!  Obstetric History OB History  Gravida Para Term Preterm AB Living  3 2 2  0 1 2  SAB TAB Ectopic Multiple Live Births  1 0 0 0 2    # Outcome Date GA Lbr Len/2nd Weight Sex Delivery Anes PTL Lv  3 Term 08/25/08    M Vag-Spont EPI N LIV  2 SAB 2010     SAB     1 Term 03/27/06    F Vag-Spont EPI  LIV      Past Medical History:  Diagnosis Date  . Asthma 2014    Past Surgical History:  Procedure Laterality Date  . VAGINOPLASTY  2012   had "vaginal tightening" after last delivery    Current Outpatient Prescriptions on File Prior to Visit  Medication Sig Dispense Refill  . albuterol (PROVENTIL HFA;VENTOLIN HFA) 108 (90 Base) MCG/ACT inhaler Inhale 2 puffs into the lungs every 6 (six) hours as needed for wheezing or shortness of breath.     No current facility-administered medications on file prior to visit.     No Known Allergies  Social History   Social History  . Marital status: Legally Separated    Spouse name: N/A  . Number of children: N/A  . Years of education: N/A   Occupational History  . Not on file.   Social History Main Topics  . Smoking status: Never Smoker  . Smokeless tobacco: Never Used  . Alcohol use No  . Drug use: No  . Sexual activity: Yes    Birth control/  protection: Pill   Other Topics Concern  . Not on file   Social History Narrative  . No narrative on file    Family History  Problem Relation Age of Onset  . Breast cancer Paternal Aunt     The following portions of the patient's history were reviewed and updated as appropriate: allergies, current medications, past family history, past medical history, past social history, past surgical history and problem list.  Review of Systems Pertinent items are noted in HPI.   Objective:  BP 116/78   Pulse 80   Ht 5\' 6"  (1.676 m)   Wt 128 lb 8 oz (58.3 kg)   LMP 11/01/2016   BMI 20.74 kg/m  CONSTITUTIONAL: Well-developed, well-nourished female in no acute distress.  HENT:  Normocephalic, atraumatic, External right and left ear normal. Oropharynx is clear and moist EYES: Conjunctivae and EOM are normal. Pupils are equal, round, and reactive to light. No scleral icterus.  NECK: Normal range of motion, supple, no masses.  Normal thyroid.  SKIN: Skin is warm and dry. No rash noted. Not diaphoretic. No erythema. No pallor. NEUROLOGIC: Alert and oriented to person, place, and time. Normal reflexes, muscle tone coordination. No cranial nerve deficit noted. PSYCHIATRIC: Normal mood and affect. Normal behavior. Normal judgment and thought content. CARDIOVASCULAR: Normal heart rate noted, regular rhythm  RESPIRATORY: Clear to auscultation bilaterally. Effort and breath sounds normal, no problems with respiration noted. BREASTS: Symmetric in size. No masses, skin changes, nipple drainage, or lymphadenopathy. ABDOMEN: Soft, normal bowel sounds, no distention noted.  No tenderness, rebound or guarding.  PELVIC: Normal appearing external genitalia; normal appearing vaginal mucosa, cervix tilted towards posterior.  No abnormal discharge noted.  Pap smear obtained. Uterus with anterior, low fiboid noted to distort cervix to posterior. No adnexal tenderness, some tenderness over low anterior uterus and  bladder MUSCULOSKELETAL: Normal range of motion. No tenderness.  No cyanosis, clubbing, or edema.  2+ distal pulses.   Assessment and Plan:  1. Well woman exam No s/s of STI but sexually active with long term partner and requesting STI screen - HIV antibody - Hepatitis B surface antigen - RPR - Hepatitis C antibody - Cytology - PAP, GC/CT  2. Contraception Happy with current contraception Reviewed risks of [place] including CV, stroke, VTE with patient. Reviewed that if she had co-morbidities, would recommend switching to another method, however, since she is otherwise healthy and age is only risk factor, okay to continue with CHCs as long as she is aware of risks. She verbalizes understanding and desires to continue with CHCs. Refill sent to pharnacy - levonorgestrel-ethinyl estradiol (AVIANE,ALESSE,LESSINA) 0.1-20 MG-MCG tablet; Take 1 tablet by mouth daily.  Dispense: 1 Package; Refill: 11  3. Urinary frequency - Urine Culture  4. Pelvic pressure in female Patient does report a history of fibroids and one noted on exam. She reports urinary frequency as well and is concerned the fibroid could be causing her frequency. Recommended urinary culture to rule out infection and also ultrasound to visualize uterus and fibroids, she is agreeable - US PELVIS TRANSVANGINAL NON-OB (TV ONLY); Future - US PELVIS (TRANSABDOMINAL ONLY); Future  Return to office after [place] for results. Will follow up results of pap smear/STI screen and manage accordingly. Mammogram scheduled  Routine preventative health maintenance measures emphasized. Please refer to After Visit Summary for other counseling recommendations.    Feliz Beam, M.D. Attending [place] for [employer], [place]

## 2016-11-17 LAB — HEPATITIS C ANTIBODY

## 2016-11-17 LAB — HIV ANTIBODY (ROUTINE TESTING W REFLEX): HIV Screen 4th Generation wRfx: NONREACTIVE

## 2016-11-17 LAB — HEPATITIS B SURFACE ANTIGEN: Hepatitis B Surface Ag: NEGATIVE

## 2016-11-17 LAB — RPR: RPR: NONREACTIVE

## 2016-11-18 LAB — URINE CULTURE

## 2016-11-20 LAB — CYTOLOGY - PAP
Chlamydia: NEGATIVE
DIAGNOSIS: NEGATIVE
HPV: NOT DETECTED
Neisseria Gonorrhea: NEGATIVE

## 2016-11-30 ENCOUNTER — Ambulatory Visit (HOSPITAL_COMMUNITY): Payer: Medicaid Other

## 2016-12-04 ENCOUNTER — Ambulatory Visit: Payer: Medicaid Other | Admitting: Obstetrics and Gynecology

## 2016-12-06 ENCOUNTER — Ambulatory Visit (HOSPITAL_COMMUNITY)
Admission: RE | Admit: 2016-12-06 | Discharge: 2016-12-06 | Disposition: A | Discharge status: Home / Self Care | Payer: Medicaid Other | Source: Ambulatory Visit | Attending: Obstetrics and Gynecology | Admitting: Obstetrics and Gynecology

## 2016-12-06 DIAGNOSIS — D259 Leiomyoma of uterus, unspecified: Secondary | ICD-10-CM | POA: Diagnosis not present

## 2016-12-06 DIAGNOSIS — R102 Pelvic and perineal pain: Secondary | ICD-10-CM | POA: Diagnosis present

## 2016-12-13 ENCOUNTER — Ambulatory Visit
Admission: RE | Admit: 2016-12-13 | Discharge: 2016-12-13 | Disposition: A | Discharge status: Home / Self Care | Payer: Medicaid Other | Source: Ambulatory Visit | Attending: Obstetrics and Gynecology | Admitting: Obstetrics and Gynecology

## 2016-12-13 DIAGNOSIS — Z1231 Encounter for screening mammogram for malignant neoplasm of breast: Secondary | ICD-10-CM

## 2016-12-14 ENCOUNTER — Encounter: Payer: Self-pay | Admitting: Obstetrics and Gynecology

## 2016-12-14 ENCOUNTER — Ambulatory Visit (INDEPENDENT_AMBULATORY_CARE_PROVIDER_SITE_OTHER): Payer: Medicaid Other | Admitting: Obstetrics and Gynecology

## 2016-12-14 VITALS — BP 111/77 | HR 83 | Wt 130.0 lb

## 2016-12-14 DIAGNOSIS — R32 Unspecified urinary incontinence: Secondary | ICD-10-CM

## 2016-12-14 DIAGNOSIS — Z712 Person consulting for explanation of examination or test findings: Secondary | ICD-10-CM | POA: Diagnosis not present

## 2016-12-14 NOTE — Progress Notes (Signed)
RGYN here to F/U on U/S results on 12/06/16 Pt has no concerns today.

## 2016-12-14 NOTE — Progress Notes (Signed)
41 yo here to discuss results of pelvic ultrasound performed on 11/21. Patient was recently seen for an annual exam and has been doing well. She denies any abnormal vaginal bleeding and reports a normal bleeding profile while on OCP. Patient had a normal mammogram 12/13/2017. Patient reports the development of urinary incontinence over the past several months. She denies dusuria bt reports frequency and urgency. She states that she is unable to hold her urine at times.  Past Medical History:  Diagnosis Date  . Asthma 2014   Past Surgical History:  Procedure Laterality Date  . VAGINOPLASTY  2012   had "vaginal tightening" after last delivery   Family History  Problem Relation Age of Onset  . Breast cancer Paternal Aunt 46   Social History   Tobacco Use  . Smoking status: Never Smoker  . Smokeless tobacco: Never Used  Substance Use Topics  . Alcohol use: No  . Drug use: No   ROS See pertinent in HPI  GENERAL: Well-developed, well-nourished female in no acute distress.  NEURO: alert and oriented x 3  FINDINGS: Uterus  Measurements: 11.7 x 4.3 x 6.6 cm. Mildly heterogeneous echogenicity with scattered nodular foci compatible of leiomyomata. Largest of these lesions measure 2.7 x 2.6 x 2.8 cm subserosal of the posterior aspect of the upper uterus, subserosal/exophytic anterior LEFT uterus 2.0 x 2.5 x 3.3 cm, and exophytic/subserosal at anterior uterus 2.1 x 2.1 x 2.2 cm  Endometrium  Thickness: 4 mm thick, normal. No endometrial fluid or focal abnormality  Right ovary  Measurements: 3.4 x 2.4 x 2.6 cm. Normal morphology without mass  Left ovary  Measurements: 2.8 x 2.3 x 2.0 cm. Normal morphology without mass  Other findings  Trace free pelvic fluid.  No adnexal masses.  IMPRESSION: Multiple uterine leiomyomata.  Remainder of exam unremarkable.  Electronically Signed   By: Lavonia Dana M.D.   On: 12/06/2016 11:51   A/P 41 yo with  asymptomatic fibroid uterus and urinary incontinence - Ultrasound results reviewed with the patient  - Given size of uterus and stable fibroid size, no surgical intervention recommended at this time - Will refer patient to seen Dr. Zigmund Daniel urogynecology for further evaluation of urinary incontinence - Results of STI screen and pap smear reviewed with the patient

## 2017-05-15 ENCOUNTER — Other Ambulatory Visit (HOSPITAL_COMMUNITY)
Admission: RE | Admit: 2017-05-15 | Discharge: 2017-05-15 | Disposition: A | Discharge status: Home / Self Care | Payer: Medicaid Other | Source: Ambulatory Visit | Attending: Obstetrics & Gynecology | Admitting: Obstetrics & Gynecology

## 2017-05-15 ENCOUNTER — Encounter: Payer: Self-pay | Admitting: Obstetrics & Gynecology

## 2017-05-15 ENCOUNTER — Ambulatory Visit: Payer: Medicaid Other | Admitting: Obstetrics & Gynecology

## 2017-05-15 VITALS — BP 116/83 | HR 90 | Temp 98.1°F | Ht 66.0 in | Wt 123.8 lb

## 2017-05-15 DIAGNOSIS — R102 Pelvic and perineal pain: Secondary | ICD-10-CM

## 2017-05-15 DIAGNOSIS — Z3202 Encounter for pregnancy test, result negative: Secondary | ICD-10-CM | POA: Diagnosis not present

## 2017-05-15 DIAGNOSIS — D219 Benign neoplasm of connective and other soft tissue, unspecified: Secondary | ICD-10-CM | POA: Diagnosis not present

## 2017-05-15 DIAGNOSIS — N941 Unspecified dyspareunia: Secondary | ICD-10-CM | POA: Insufficient documentation

## 2017-05-15 LAB — POCT URINALYSIS DIPSTICK
BILIRUBIN UA: NEGATIVE
GLUCOSE UA: NEGATIVE
Ketones, UA: NEGATIVE
LEUKOCYTES UA: NEGATIVE
Nitrite, UA: NEGATIVE
Protein, UA: NEGATIVE
RBC UA: NEGATIVE
Spec Grav, UA: 1.015 (ref 1.010–1.025)
pH, UA: 6 (ref 5.0–8.0)

## 2017-05-15 LAB — POCT URINE PREGNANCY: Preg Test, Ur: NEGATIVE

## 2017-05-15 MED ORDER — LEVONORGEST-ETH ESTRAD 91-DAY 0.15-0.03 &0.01 MG PO TABS: 1.0000 | ORAL_TABLET | Freq: Every day | ORAL | 4 refills | Status: DC

## 2017-05-15 MED ORDER — IBUPROFEN 600 MG PO TABS: 600.0000 mg | ORAL_TABLET | Freq: Four times a day (QID) | ORAL | 1 refills | Status: DC | PRN

## 2017-05-15 NOTE — Patient Instructions (Signed)
Dyspareunia, Female Dyspareunia is pain that is associated with sexual activity. This can affect any part of the genitals or lower abdomen, and there are many possible causes. This condition ranges from mild to severe. Depending on the cause, dyspareunia may get better with treatment, or it may return (recur) over time. What are the causes? The cause of this condition is not always known. Possible causes include:  Cancer.  Psychological factors, such as depression, anxiety, or previous traumatic experiences.  Severe pain and tenderness of the skin around the vagina (vulva) when it is touched (vulvar vestibulitis syndrome).  Infection of the pelvis or the vulva.  Infection of the vagina.  Painful, involuntary tightening (contraction) of the vaginal muscles when anything is put inside the vagina (vaginismus).  Allergic reaction.  Ovarian cysts.  Solid growths of tissue (tumors) in the ovaries or the uterus.  Scar tissue in the ovaries, vagina, or pelvis.  Vaginal dryness.  Thinning of the tissue (atrophy) of the vulva and vagina.  Skin conditions that affect the vulva (vulvar dermatoses), such as lichen sclerosus or lichen planus.  Endometriosis.  Tubal pregnancy.  A tilted uterus.  Uterine prolapse.  Adhesions in the vagina.  Bladder problems.  Intestinal problems.  Certain medicines.  Medical conditions such as diabetes, arthritis, or thyroid disease.  What increases the risk? The following factors may make you more likely to develop this condition:  Having experienced physical or sexual trauma.  Having given birth more than once.  Taking birth control pills.  Having gone through menopause.  Having recently given birth, typically within the past 3-6 months.  Breastfeeding.  What are the signs or symptoms? The main symptom of this condition is pain in any part of the genitals or lower abdomen during or after sexual activity. This may include pain  during sexual arousal, genital stimulation, or orgasm. Pain may get worse when anything is inserted into the vagina, or when the genitals are touched in any way, such as when sitting or wearing pants. Pain can range from mild to severe, depending on the cause of the condition. In some cases, symptoms go away with treatment and return (recur) at a later date. How is this diagnosed? This condition may be diagnosed based on:  Your symptoms, including: ? Where your pain is located. ? When your pain occurs.  Your medical history.  A physical exam. This may include a pelvic exam and a Pap test. This is a screening test that is used to check for signs of cancer of the vagina, cervix, and uterus.  Tests, including: ? Blood tests. ? Ultrasound. This uses sound waves to make a picture of the area that is being tested. ? Urine culture. This test involves checking a urine sample for signs of infection. ? Culture test. This is when your health care provider uses a swab to collect a sample of vaginal fluid. The sample is checked for signs of infection. ? X-rays. ? MRI. ? CT scan. ? Laparoscopy. This is a procedure in which a small incision is made in your lower abdomen and a lighted, pencil-sized instrument (laparoscope) is passed through the incision and used to look inside your pelvis.  You may be referred to a health care provider who specializes in women's health (gynecologist). In some cases, diagnosing the cause of dyspareunia can be difficult. How is this treated? Treatment depends on the cause of your condition and your symptoms. In most cases, you may need to stop sexual activity until your symptoms   improve. Treatment may include:  Lubricants.  Kegel exercises or vaginal dilators.  Medicated skin creams.  Medicated vaginal creams.  Hormonal therapy.  Antibiotic medicine to prevent or fight infection.  Medicines that help to relieve pain.  Medicines that treat depression  (antidepressants).  Psychological counseling.  Sex therapy.  Surgery.  Follow these instructions at home: Lifestyle  Avoid tight clothing and irritating materials around your genital and abdominal area.  Use water-based lubricants as needed. Avoid oil-based lubricants.  Do not use any products that irritate you. This may include certain condoms, spermicides, lubricants, soaps, tampons, vaginal sprays, or douches.  Always practice safe sex. Talk with your health care provider about which form of birth control (contraception) is best for you.  Maintain open communication with your sexual partner. General instructions  Take over-the-counter and prescription medicines only as told by your health care provider.  If you had tests done, it is your responsibility to get your tests results. Ask your health care provider or the department performing the test when your results will be ready.  Urinate before you engage in sexual activity.  Consider joining a support group.  Keep all follow-up visits as told by your health care provider. This is important. Contact a health care provider if:  You develop vaginal bleeding after sexual intercourse.  You develop a lump at the opening of your vagina. Seek medical care even if the lump is painless.  You have: ? Abnormal vaginal discharge. ? Vaginal dryness. ? Itchiness or irritation of your vulva or vagina. ? A new rash. ? Symptoms that get worse or do not improve with treatment. ? A fever. ? Pain when you urinate. ? Blood in your urine. Get help right away if:  You develop severe pain in your abdomen during or shortly after sexual intercourse.  You pass out after having sexual intercourse. This information is not intended to replace advice given to you by your health care provider. Make sure you discuss any questions you have with your health care provider. Document Released: 01/22/2007 Document Revised: 05/14/2015 Document  Reviewed: 08/04/2014 Elsevier Interactive Patient Education  2018 [employer]  

## 2017-05-15 NOTE — Progress Notes (Signed)
Patient ID: Catherine Estrada, female   DOB: Sep 08, 1975, 42 y.o.   MRN: [id number]  Chief Complaint  Patient presents with  . Pelvic Pain    HPI Catherine Estrada is a 42 y.o. female.  [id number] Patient's last menstrual period was 04/18/2017. She reports pelvic pain that follows her menses for the last 3 cycles. Menses last 3-4 days with no pain. She has had deep dyspareunia for years with as diagnosis of fibroids. She had not reported this previously HPI  Past Medical History:  Diagnosis Date  . Asthma 2014    Past Surgical History:  Procedure Laterality Date  . VAGINOPLASTY  2012   had "vaginal tightening" after last delivery    Family History  Problem Relation Age of Onset  . Breast cancer Paternal Aunt 67    Social History Social History   Tobacco Use  . Smoking status: Never Smoker  . Smokeless tobacco: Never Used  Substance Use Topics  . Alcohol use: No  . Drug use: No    No Known Allergies  Current Outpatient Medications  Medication Sig Dispense Refill  . levonorgestrel-ethinyl estradiol (AVIANE,ALESSE,LESSINA) 0.1-20 MG-MCG tablet Take 1 tablet by mouth daily. 1 Package 11  . albuterol (PROVENTIL HFA;VENTOLIN HFA) 108 (90 Base) MCG/ACT inhaler Inhale 2 puffs into the lungs every 6 (six) hours as needed for wheezing or shortness of breath.    Marland Kitchen ibuprofen (ADVIL,MOTRIN) 600 MG tablet Take 1 tablet (600 mg total) by mouth every 6 (six) hours as needed. 30 tablet 1  . Levonorgestrel-Ethinyl Estradiol (AMETHIA,CAMRESE) 0.15-0.03 &0.01 MG tablet Take 1 tablet by mouth daily. 1 Package 4   No current facility-administered medications for this visit.     Review of Systems Review of Systems  Constitutional: Negative.   Gastrointestinal: Negative.   Genitourinary: Positive for dyspareunia and pelvic pain. Negative for frequency, vaginal bleeding and vaginal discharge.    Blood pressure 116/83, pulse 90, temperature 98.1 F (36.7 C), height 5\' 6"  (1.676 m), weight 123 lb  12.8 oz (56.2 kg), last menstrual period 04/18/2017.  Physical Exam Physical Exam  Constitutional: She appears well-developed and well-nourished. No distress.  HENT:  Head: Normocephalic.  Abdominal: Soft. She exhibits no distension. There is no tenderness.  Genitourinary: Vagina normal. No vaginal discharge found.  Genitourinary Comments: Uterus 6 weeks not tender not masses  Skin: Skin is warm and dry.  Psychiatric: She has a normal mood and affect. Her behavior is normal.  Vitals reviewed.   Data Reviewed [place] 12/2015 and 11/2016 results stable  Assessment    Patient Active Problem List   Diagnosis Date Noted  . Dyspareunia in female 05/15/2017  . Encounter to discuss test results 12/14/2016  . Encounter for counseling regarding contraception 11/16/2016  . Routine screening for STI (sexually transmitted infection) 11/16/2016  . Fibroids 01/04/2016       Plan    Will try extended cycle OCP and ibuprofen for pain as Tylenol has not been effective. Prophylactic dose of ibuprofen may help dyspareunia but timing would be difficult. RTC 6 months to review progress. If these measures are not satisfactory TVH would be and option.       Emeterio Reeve 05/15/2017, 9:58 AM

## 2017-05-15 NOTE — Addendum Note (Signed)
Addended by: Maryruth Eve on: 05/15/2017 11:06 AM   Modules accepted: Orders

## 2017-05-15 NOTE — Addendum Note (Signed)
Addended by: Maryruth Eve on: 05/15/2017 11:15 AM   Modules accepted: Orders

## 2017-05-15 NOTE — Progress Notes (Signed)
Pt presents for pelvic pain x 3 months.

## 2017-05-16 LAB — CERVICOVAGINAL ANCILLARY ONLY
BACTERIAL VAGINITIS: NEGATIVE
CANDIDA VAGINITIS: NEGATIVE
Chlamydia: NEGATIVE
Neisseria Gonorrhea: NEGATIVE
Trichomonas: NEGATIVE

## 2018-07-24 ENCOUNTER — Encounter: Payer: Self-pay | Admitting: Advanced Practice Midwife

## 2018-07-24 ENCOUNTER — Ambulatory Visit (INDEPENDENT_AMBULATORY_CARE_PROVIDER_SITE_OTHER): Payer: Medicaid Other | Admitting: Advanced Practice Midwife

## 2018-07-24 ENCOUNTER — Other Ambulatory Visit: Payer: Self-pay

## 2018-07-24 ENCOUNTER — Other Ambulatory Visit (HOSPITAL_COMMUNITY)
Admission: RE | Admit: 2018-07-24 | Discharge: 2018-07-24 | Disposition: A | Discharge status: Home / Self Care | Payer: Medicaid Other | Source: Ambulatory Visit | Attending: Advanced Practice Midwife | Admitting: Advanced Practice Midwife

## 2018-07-24 VITALS — BP 112/81 | HR 79 | Ht 66.0 in | Wt 128.5 lb

## 2018-07-24 DIAGNOSIS — Z01419 Encounter for gynecological examination (general) (routine) without abnormal findings: Secondary | ICD-10-CM | POA: Diagnosis not present

## 2018-07-24 DIAGNOSIS — Z30011 Encounter for initial prescription of contraceptive pills: Secondary | ICD-10-CM

## 2018-07-24 DIAGNOSIS — T8332XA Displacement of intrauterine contraceptive device, initial encounter: Secondary | ICD-10-CM

## 2018-07-24 DIAGNOSIS — Z0000 Encounter for general adult medical examination without abnormal findings: Secondary | ICD-10-CM | POA: Diagnosis not present

## 2018-07-24 DIAGNOSIS — Z3202 Encounter for pregnancy test, result negative: Secondary | ICD-10-CM

## 2018-07-24 LAB — POCT URINE PREGNANCY: Preg Test, Ur: NEGATIVE

## 2018-07-24 MED ORDER — LEVONORGESTREL-ETHINYL ESTRAD 0.1-20 MG-MCG PO TABS: 1.0000 | ORAL_TABLET | Freq: Every day | ORAL | 11 refills | Status: DC

## 2018-07-24 NOTE — Patient Instructions (Signed)
Uterine Fibroids  Uterine fibroids (leiomyomas) are noncancerous (benign) tumors that can develop in the uterus. Fibroids may also develop in the fallopian tubes, cervix, or tissues (ligaments) near the uterus. You may have one or many fibroids. Fibroids vary in size, weight, and where they grow in the uterus. Some can become quite large. Most fibroids do not require medical treatment. What are the causes? The cause of this condition is not known. What increases the risk? You are more likely to develop this condition if you:  Are in your 30s or 40s and have not gone through menopause.  Have a family history of this condition.  Are of African-American descent.  Had your first period at an early age (early menarche).  Have not had any children (nulliparity).  Are overweight or obese. What are the signs or symptoms? Many women do not have any symptoms. Symptoms of this condition may include:  Heavy menstrual bleeding.  Bleeding or spotting between periods.  Pain and pressure in the pelvic area, between the hips.  Bladder problems, such as needing to urinate urgently or more often than usual.  Inability to have children (infertility).  Failure to carry pregnancy to term (miscarriage). How is this diagnosed? This condition may be diagnosed based on:  Your symptoms and medical history.  A physical exam.  A pelvic exam that includes feeling for any tumors.  Imaging tests, such as ultrasound or MRI. How is this treated? Treatment for this condition may include:  Seeing your health care provider for follow-up visits to monitor your fibroids for any changes.  Taking NSAIDs such as ibuprofen, naproxen, or aspirin to reduce pain.  Hormone medicines. These may be taken as a pill, given in an injection, or delivered by a T-shaped device that is inserted into the uterus (intrauterine device, IUD).  Surgery to remove one of the following: ? The fibroids (myomectomy). Your health  care provider may recommend this if fibroids affect your fertility and you want to become pregnant. ? The uterus (hysterectomy). ? Blood supply to the fibroids (uterine artery embolization). Follow these instructions at home:  Take over-the-counter and prescription medicines only as told by your health care provider.  Ask your health care provider if you should take iron pills or eat more iron-rich foods, such as dark green, leafy vegetables. Heavy menstrual bleeding can cause low iron levels.  If directed, apply heat to your back or abdomen to reduce pain. Use the heat source that your health care provider recommends, such as a moist heat pack or a heating pad. ? Place a towel between your skin and the heat source. ? Leave the heat on for 20-30 minutes. ? Remove the heat if your skin turns bright red. This is especially important if you are unable to feel pain, heat, or cold. You may have a greater risk of getting burned.  Pay close attention to your menstrual cycle. Tell your health care provider about any changes, such as: ? Increased blood flow that requires you to use more pads or tampons than usual. ? A change in the number of days that your period lasts. ? A change in symptoms that are associated with your period, such as back pain or cramps in your abdomen.  Keep all follow-up visits as told by your health care provider. This is important, especially if your fibroids need to be monitored for any changes. Contact a health care provider if you:  Have pelvic pain, back pain, or cramps in your abdomen that   do not get better with medicine or heat.  Develop new bleeding between periods.  Have increased bleeding during or between periods.  Feel unusually tired or weak.  Feel light-headed. Get help right away if you:  Faint.  Have pelvic pain that suddenly gets worse.  Have severe vaginal bleeding that soaks a tampon or pad in 30 minutes or less. Summary  Uterine fibroids are  noncancerous (benign) tumors that can develop in the uterus.  The exact cause of this condition is not known.  Most fibroids do not require medical treatment unless they affect your ability to have children (fertility).  Contact a health care provider if you have pelvic pain, back pain, or cramps in your abdomen that do not get better with medicines.  Make sure you know what symptoms should cause you to get help right away. This information is not intended to replace advice given to you by your health care provider. Make sure you discuss any questions you have with your health care provider. Document Released: 12/31/1999 Document Revised: 12/15/2016 Document Reviewed: 11/28/2016 Elsevier Patient Education  2020 [employer].  Oral Contraception Information Oral contraceptive pills (OCPs) are medicines taken to prevent pregnancy. OCPs are taken by mouth, and they work by:  Preventing the ovaries from releasing eggs.  Thickening mucus in the lower part of the uterus (cervix), which prevents sperm from entering the uterus.  Thinning the lining of the uterus (endometrium), which prevents a fertilized egg from attaching to the endometrium. OCPs are highly effective when taken exactly as prescribed. However, OCPs do not prevent STIs (sexually transmitted infections). Safe sex practices, such as using condoms while on an OCP, can help prevent STIs. Before starting OCPs Before you start taking OCPs, you may have a physical exam, blood test, and Pap test. However, you are not required to have a pelvic exam in order to be prescribed OCPs. Your health care provider will make sure you are a good candidate for oral contraception. OCPs are not a good option for certain women, including women who smoke and are older than 35 years, and women with a medical history of high blood pressure, deep vein thrombosis, pulmonary embolism, stroke, cardiovascular disease, or peripheral vascular disease. Discuss with  your health care provider the possible side effects of the OCP you may be prescribed. When you start an OCP, be aware that it can take 2-3 months for your body to adjust to changes in hormone levels. Follow instructions from your health care provider about how to start taking your first cycle of OCPs. Depending on when you start the pill, you may need to use a backup form of birth control, such as condoms, during the first week. Make sure you know what steps to take if you ever forget to take the pill. Types of oral contraception  The most common types of birth control pills contain the hormones estrogen and progestin (synthetic progesterone) or progestin only. The combination pill This type of pill contains estrogen and progestin hormones. Combination pills often come in packs of 21, 28, or 91 pills. For each pack, the last 7 pills may not contain hormones, which means you may stop taking the pills for 7 days. Menstrual bleeding occurs during the week that you do not take the pills or that you take the pills with no hormones in them. The minipill This type of pill contains the progestin hormone only. It comes in packs of 28 pills. All 28 pills contain the hormone. You take the  pill every day. It is very important to take the pill at the same time each day. Advantages of oral contraceptive pills  Provides reliable and continuous contraception if taken as instructed.  May treat or decrease symptoms of: ? Menstrual period cramps. ? Irregular menstrual cycle or bleeding. ? Heavy menstrual flow. ? Abnormal uterine bleeding. ? Acne, depending on the type of pill. ? Polycystic ovarian syndrome. ? Endometriosis. ? Iron deficiency anemia. ? Premenstrual symptoms, including premenstrual dysphoric disorder.  May reduce the risk of endometrial and ovarian cancer.  Can be used as emergency contraception.  Prevents mislocated (ectopic) pregnancies and infections of the fallopian tubes. Things that  can make oral contraceptive pills less effective OCPs can be less effective if:  You forget to take the pill at the same time every day. This is especially important when taking the minipill.  You have a stomach or intestinal disease that reduces your body's ability to absorb the pill.  You take OCPs with other medicines that make OCPs less effective, such as antibiotics, certain HIV medicines, and some seizure medicines.  You take expired OCPs.  You forget to restart the pill on day 7, if using the packs of 21 pills. Risks associated with oral contraceptive pills Oral contraceptive pills can sometimes cause side effects, such as:  Headache.  Depression.  Trouble sleeping.  Nausea and vomiting.  Breast tenderness.  Irregular bleeding or spotting during the first several months.  Bloating or fluid retention.  Increase in blood pressure. Combination pills are also associated with a small increase in the risk of:  Blood clots.  Heart attack.  Stroke. Summary  Oral contraceptive pills are medicines taken by mouth to prevent pregnancy. They are highly effective when taken exactly as prescribed.  The most common types of birth control pills contain the hormones estrogen and progestin (synthetic progesterone) or progestin only.  Before you start taking the pill, you may have a physical exam, blood test, and Pap test. Your health care provider will make sure you are a good candidate for oral contraception.  The combination pill may come in a 21-day pack, a 28-day pack, or a 91-day pack. The minipill contains the progesterone hormone only and comes in packs of 28 pills.  Oral contraceptive pills can sometimes cause side effects, such as headache, nausea, breast tenderness, or irregular bleeding. This information is not intended to replace advice given to you by your health care provider. Make sure you discuss any questions you have with your health care provider. Document  Released: 03/25/2002 Document Revised: 12/15/2016 Document Reviewed: 03/28/2016 Elsevier Patient Education  [place] is the normal time of life before and after menstrual periods stop completely (menopause). Perimenopause can begin 2-8 years before menopause, and it usually lasts for 1 year after menopause. During perimenopause, the ovaries may or may not produce an egg. What are the causes? This condition is caused by a natural change in hormone levels that happens as you get older. What increases the risk? This condition is more likely to start at an earlier age if you have certain medical conditions or treatments, including:  A tumor of the pituitary gland in the brain.  A disease that affects the ovaries and hormone production.  Radiation treatment for cancer.  Certain cancer treatments, such as chemotherapy or hormone (anti-estrogen) therapy.  Heavy smoking and excessive alcohol use.  Family history of early menopause. What are the signs or symptoms? Perimenopausal changes affect each woman differently.  Symptoms of this condition may include:  Hot flashes.  Night sweats.  Irregular menstrual periods.  Decreased sex drive.  Vaginal dryness.  Headaches.  Mood swings.  Depression.  Memory problems or trouble concentrating.  Irritability.  Tiredness.  Weight gain.  Anxiety.  Trouble getting pregnant. How is this diagnosed? This condition is diagnosed based on your medical history, a physical exam, your age, your menstrual history, and your symptoms. Hormone tests may also be done. How is this treated? In some cases, no treatment is needed. You and your health care provider should make a decision together about whether treatment is necessary. Treatment will be based on your individual condition and preferences. Various treatments are available, such as:  Menopausal hormone therapy (MHT).  Medicines to treat specific  symptoms.  Acupuncture.  Vitamin or herbal supplements. Before starting treatment, make sure to let your health care provider know if you have a personal or family history of:  Heart disease.  Breast cancer.  Blood clots.  Diabetes.  Osteoporosis. Follow these instructions at home: Lifestyle  Do not use any products that contain nicotine or tobacco, such as cigarettes and e-cigarettes. If you need help quitting, ask your health care provider.  Eat a balanced diet that includes fresh fruits and vegetables, whole grains, soybeans, eggs, lean meat, and low-fat dairy.  Get at least 30 minutes of physical activity on 5 or more days each week.  Avoid alcoholic and caffeinated beverages, as well as spicy foods. This may help prevent hot flashes.  Get 7-8 hours of sleep each night.  Dress in layers that can be removed to help you manage hot flashes.  Find ways to manage stress, such as deep breathing, meditation, or journaling. General instructions  Keep track of your menstrual periods, including: ? When they occur. ? How heavy they are and how long they last. ? How much time passes between periods.  Keep track of your symptoms, noting when they start, how often you have them, and how long they last.  Take over-the-counter and prescription medicines only as told by your health care provider.  Take vitamin supplements only as told by your health care provider. These may include calcium, vitamin E, and vitamin D.  Use vaginal lubricants or moisturizers to help with vaginal dryness and improve comfort during sex.  Talk with your health care provider before starting any herbal supplements.  Keep all follow-up visits as told by your health care provider. This is important. This includes any group therapy or counseling. Contact a health care provider if:  You have heavy vaginal bleeding or pass blood clots.  Your period lasts more than 2 days longer than normal.  Your  periods are recurring sooner than 21 days.  You bleed after having sex. Get help right away if:  You have chest pain, trouble breathing, or trouble talking.  You have severe depression.  You have pain when you urinate.  You have severe headaches.  You have vision problems. Summary  Perimenopause is the time when a woman's body begins to move into menopause. This may happen naturally or as a result of other health problems or medical treatments.  Perimenopause can begin 2-8 years before menopause, and it usually lasts for 1 year after menopause.  Perimenopausal symptoms can be managed through medicines, lifestyle changes, and complementary therapies such as acupuncture. This information is not intended to replace advice given to you by your health care provider. Make sure you discuss any questions you have with your  health care provider. Document Released: 02/10/2004 Document Revised: 12/15/2016 Document Reviewed: 02/08/2016 Elsevier Patient Education  2020 [employer].

## 2018-07-24 NOTE — Progress Notes (Signed)
Patient is in the office for annual, last pap 11-16-16. Pt is currently not on [facility] but desires to start pills. Pt wants std testing today, complains of discharge and pelvic pain. GAD-7= 1

## 2018-07-24 NOTE — Progress Notes (Signed)
GYNECOLOGY ANNUAL PREVENTATIVE CARE ENCOUNTER NOTE  Subjective:   Catherine Estrada is a 43 y.o. G46P2012 female here for a routine annual gynecologic exam.  Current complaints: last week patient took OTC AZO because she thought she had a UTI. She had some pain/pressure while urinating. She is also having increased discharge. She would like to resume oral contraceptives today. She has taken in the past without problems. She denies smoking.   Gynecologic History No LMP recorded. unsure of exact date. Still getting regular cycles, but reports that they are heavier now.  Contraception: none, would like to resume OCPs Last Pap: 11/16/2016. Results were: normal Last mammogram: 12/13/2016. Results were: normal  Obstetric History OB History  Gravida Para Term Preterm AB Living  3 2 2  0 1 2  SAB TAB Ectopic Multiple Live Births  1 0 0 0 2    # Outcome Date GA Lbr Len/2nd Weight Sex Delivery Anes PTL Lv  3 Term 08/25/08    M Vag-Spont EPI N LIV  2 SAB 2010     SAB     1 Term 03/27/06    F Vag-Spont EPI  LIV    Past Medical History:  Diagnosis Date  . Asthma 2014    Past Surgical History:  Procedure Laterality Date  . VAGINOPLASTY  2012   had "vaginal tightening" after last delivery    Current Outpatient Medications on File Prior to Visit  Medication Sig Dispense Refill  . albuterol (PROVENTIL HFA;VENTOLIN HFA) 108 (90 Base) MCG/ACT inhaler Inhale 2 puffs into the lungs every 6 (six) hours as needed for wheezing or shortness of breath.    . Multiple Vitamin (MULTIVITAMIN) tablet Take 1 tablet by mouth daily.    Marland Kitchen ibuprofen (ADVIL,MOTRIN) 600 MG tablet Take 1 tablet (600 mg total) by mouth every 6 (six) hours as needed. (Patient not taking: Reported on 07/24/2018) 30 tablet 1   No current facility-administered medications on file prior to visit.     No Known Allergies  Social History   Socioeconomic History  . Marital status: Legally Separated    Spouse name: Not on file  . Number of  children: Not on file  . Years of education: Not on file  . Highest education level: Not on file  Occupational History  . Not on file  Social Needs  . Financial resource strain: Not on file  . Food insecurity    Worry: Not on file    Inability: Not on file  . Transportation needs    Medical: Not on file    Non-medical: Not on file  Tobacco Use  . Smoking status: Never Smoker  . Smokeless tobacco: Never Used  Substance and Sexual Activity  . Alcohol use: No  . Drug use: No  . Sexual activity: Yes  Lifestyle  . Physical activity    Days per week: Not on file    Minutes per session: Not on file  . Stress: Not on file  Relationships  . Social Herbalist on phone: Not on file    Gets together: Not on file    Attends religious service: Not on file    Active member of club or organization: Not on file    Attends meetings of clubs or organizations: Not on file    Relationship status: Not on file  . Intimate partner violence    Fear of current or ex partner: Not on file    Emotionally abused: Not on file  Physically abused: Not on file    Forced sexual activity: Not on file  Other Topics Concern  . Not on file  Social History Narrative  . Not on file    Family History  Problem Relation Age of Onset  . Breast cancer Paternal Aunt 84    The following portions of the patient's history were reviewed and updated as appropriate: allergies, current medications, past family history, past medical history, past social history, past surgical history and problem list.  Review of Systems Pertinent items noted in HPI and remainder of comprehensive ROS otherwise negative.   Objective:  BP 112/81   Pulse 79   Ht 5\' 6"  (1.676 m)   Wt 128 lb 8 oz (58.3 kg)   BMI 20.74 kg/m  CONSTITUTIONAL: Well-developed, well-nourished female in no acute distress.  HENT:  Normocephalic, atraumatic, External right and left ear normal. Oropharynx is clear and moist EYES: Conjunctivae  and EOM are normal. Pupils are equal, round, and reactive to light. No scleral icterus.  NECK: Normal range of motion, supple, no masses.  Normal thyroid.  SKIN: Skin is warm and dry. No rash noted. Not diaphoretic. No erythema. No pallor. NEUROLOGIC: Alert and oriented to person, place, and time. Normal reflexes, muscle tone coordination. No cranial nerve deficit noted. PSYCHIATRIC: Normal mood and affect. Normal behavior. Normal judgment and thought content. CARDIOVASCULAR: Normal heart rate noted, regular rhythm RESPIRATORY: Clear to auscultation bilaterally. Effort and breath sounds normal, no problems with respiration noted. BREASTS: Symmetric in size. No masses, skin changes, nipple drainage, or lymphadenopathy. ABDOMEN: Soft, normal bowel sounds, no distention noted.  No tenderness, rebound or guarding.  PELVIC: Normal appearing external genitalia; normal appearing vaginal mucosa and cervix.  Egg white cervical mucous noted.  Pap smear obtained.  Approx 10 weeks uterine size with fibroids noted, no other palpable masses, no uterine or adnexal tenderness. MUSCULOSKELETAL: Normal range of motion. No tenderness.  No cyanosis, clubbing, or edema.  2+ distal pulses.  Results for orders placed or performed in visit on 07/24/18 (from the past 24 hour(s))  POCT urine pregnancy     Status: None   Collection Time: 07/24/18  8:33 AM  Result Value Ref Range   Preg Test, Ur Negative Negative    Assessment and Plan:  1. Well woman exam - Cytology - PAP( [place]) - Cervicovaginal ancillary only( [place]) - HIV antibody (with reflex) - Family Planning Medicaid does not cover mammogram. Will connect patient with [facility] program, and patient given application for mammogram scholarship.   2. Encounter for initial prescription of contraceptive pills - POCT urine pregnancy - levonorgestrel-ethinyl estradiol (ALESSE) 0.1-20 MG-MCG tablet; Take 1 tablet by mouth daily.  Dispense: 1 Package; Refill:  11  Will follow up results of pap smear and manage accordingly. Mammogram scheduled Routine preventative health maintenance measures emphasized. Please refer to After Visit Summary for other counseling recommendations.   Marcille Buffy DNP, CNM  07/24/18  8:51 AM

## 2018-07-25 LAB — HIV ANTIBODY (ROUTINE TESTING W REFLEX): HIV Screen 4th Generation wRfx: NONREACTIVE

## 2018-07-29 LAB — CYTOLOGY - PAP
Diagnosis: NEGATIVE
HPV: NOT DETECTED

## 2018-07-29 LAB — CERVICOVAGINAL ANCILLARY ONLY
Chlamydia: NEGATIVE
Neisseria Gonorrhea: NEGATIVE

## 2018-07-30 ENCOUNTER — Other Ambulatory Visit (HOSPITAL_COMMUNITY): Payer: Self-pay | Admitting: *Deleted

## 2018-07-30 DIAGNOSIS — Z1231 Encounter for screening mammogram for malignant neoplasm of breast: Secondary | ICD-10-CM

## 2018-10-10 ENCOUNTER — Other Ambulatory Visit: Payer: Self-pay

## 2018-10-10 ENCOUNTER — Encounter (HOSPITAL_COMMUNITY): Payer: Self-pay

## 2018-10-10 ENCOUNTER — Ambulatory Visit
Admission: RE | Admit: 2018-10-10 | Discharge: 2018-10-10 | Disposition: A | Discharge status: Home / Self Care | Payer: No Typology Code available for payment source | Source: Ambulatory Visit | Attending: Obstetrics and Gynecology | Admitting: Obstetrics and Gynecology

## 2018-10-10 ENCOUNTER — Ambulatory Visit (HOSPITAL_COMMUNITY)
Admission: RE | Admit: 2018-10-10 | Discharge: 2018-10-10 | Disposition: A | Discharge status: Home / Self Care | Payer: Medicaid Other | Source: Ambulatory Visit | Attending: Obstetrics and Gynecology | Admitting: Obstetrics and Gynecology

## 2018-10-10 DIAGNOSIS — Z1231 Encounter for screening mammogram for malignant neoplasm of breast: Secondary | ICD-10-CM

## 2018-10-10 DIAGNOSIS — Z1239 Encounter for other screening for malignant neoplasm of breast: Secondary | ICD-10-CM

## 2018-10-10 HISTORY — DX: Benign neoplasm of connective and other soft tissue, unspecified: D21.9

## 2018-10-10 HISTORY — DX: Gastritis, unspecified, without bleeding: K29.70

## 2018-10-10 NOTE — Patient Instructions (Signed)
Explained breast self awareness with [name]. Patient did not need a Pap smear today due to last Pap smear and HPV typing was 07/24/2018. Let her know [facility] will cover Pap smears and HPV typing every 5 years unless has a history of abnormal Pap smears. Referred patient to the [place] [facility] for a screening mammogram. Appointment scheduled for Thursday, October 10, 2018 at 1040. Patient aware of appointment and will be there. Let [facility] know [facility] will follow up with her within the next couple weeks with results of mammogram by letter or phone. [name] verbalized understanding.  Elleni Mozingo, Arvil [place], RN 9:26 AM

## 2018-10-10 NOTE — Progress Notes (Signed)
No complaints today.   Pap Smear: Pap smear not completed today. Last Pap smear was 07/24/2018 at [facility] and normal with negative HPV. Per patient has no history of an abnormal Pap smear. Last two Pap smear results are in Epic.  Physical exam: Breasts Breasts symmetrical. No skin abnormalities bilateral breasts. No nipple retraction bilateral breasts. No nipple discharge bilateral breasts. No lymphadenopathy. No lumps palpated bilateral breasts. No complaints of pain or tenderness on exam. Referred patient to the [place] for a screening mammogram. Appointment scheduled for Thursday, October 10, 2018 at 1040.        Pelvic/Bimanual No Pap smear completed today since last Pap smear and HPV typing was 07/24/2018. Pap smear not indicated per [facility]CP guidelines.   Smoking History: Patient has never smoked.  Patient Navigation: Patient education provided. Access to services provided for patient through [facility] program.   Breast and Cervical Cancer Risk Assessment: Patient has a family history of a paternal aunt having breast cancer. Patient has no known genetic mutations or history of radiation treatment to the chest before age 74. Patient has no history of cervical dysplasia, immunocompromised, or DES exposure in-utero.  Risk Assessment    Risk Scores      10/10/2018   Last edited by: Armond Hang, LPN   5-year risk: 0.7 %   Lifetime risk: 9.5 %

## 2018-11-27 IMAGING — US US PELVIS COMPLETE
1 series · 15 of 25 positions shown · non-contrast
Comparison: 11/29/2015

CLINICAL DATA: Pelvic pressure in a female, chronic pelvic pain,
urinary frequency, history of fibroids

EXAM:
TRANSABDOMINAL AND TRANSVAGINAL ULTRASOUND OF PELVIS
TECHNIQUE: Both transabdominal and transvaginal ultrasound examinations of the
pelvis were performed. Transabdominal technique was performed for
global imaging of the pelvis including uterus, ovaries, adnexal
regions, and pelvic cul-de-sac. It was necessary to proceed with
endovaginal exam following the transabdominal exam to visualize the
uterus and endometrium.

[Series 1: us pelvis complete · 15 of 104 slices shown]
[im 1/104]
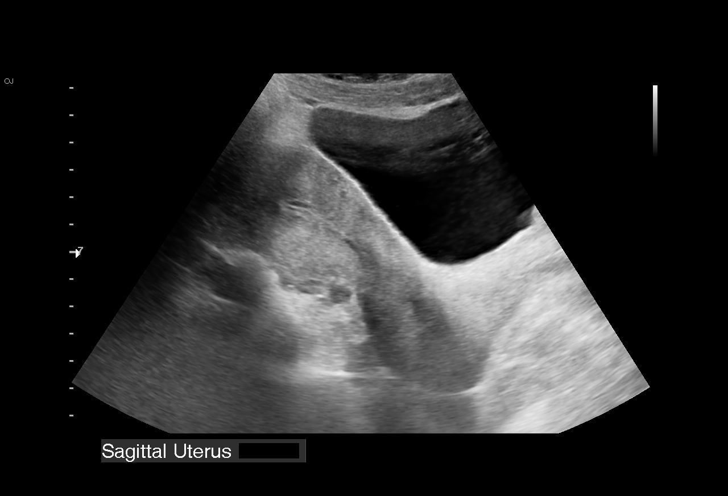
[im 9/104]
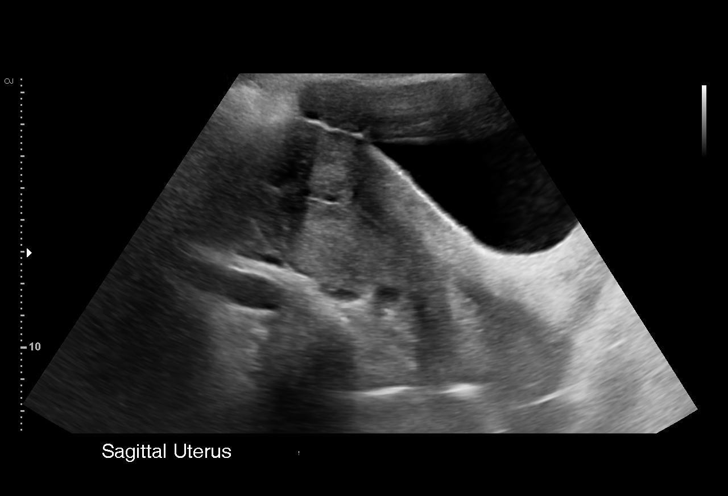
[im 18/104]
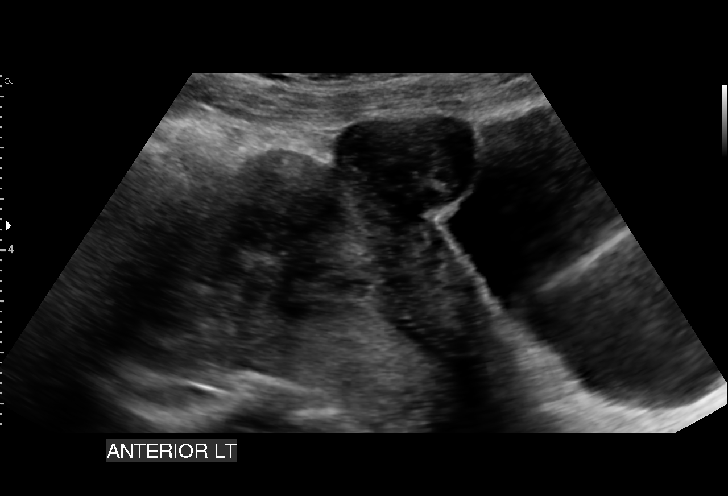
[im 22/104]
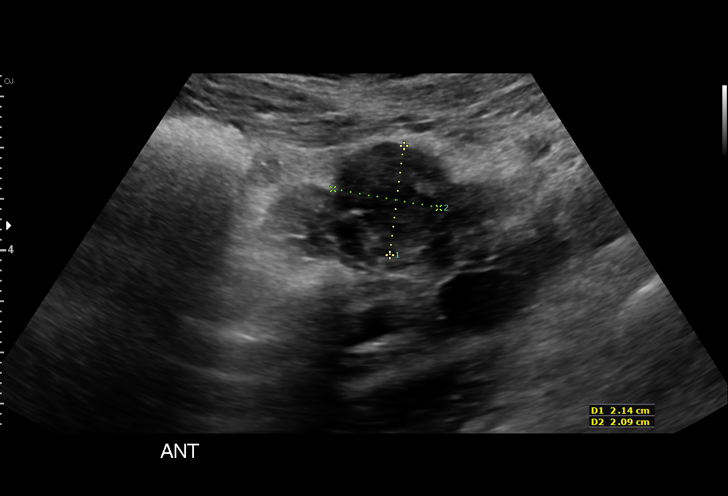
[im 31/104]
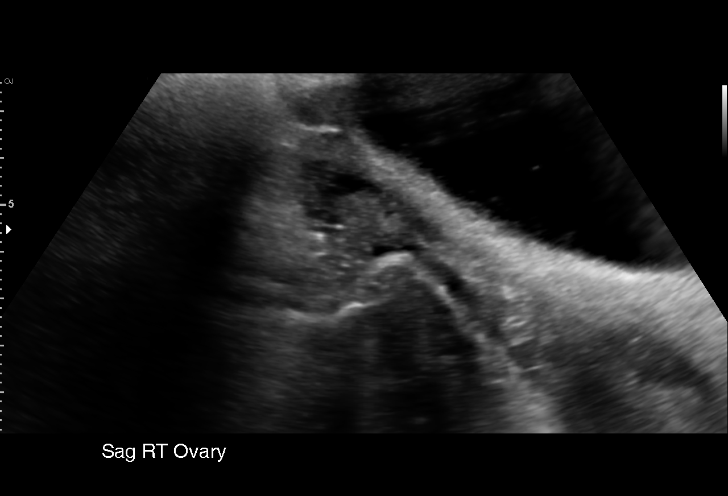
[im 39/104]
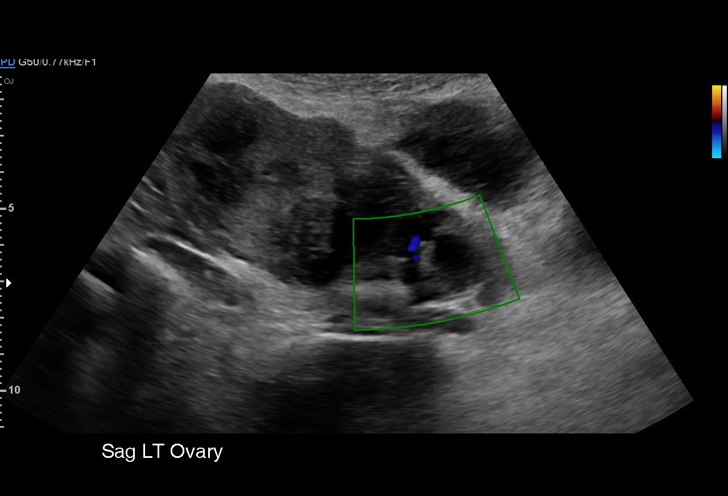
[im 43/104]
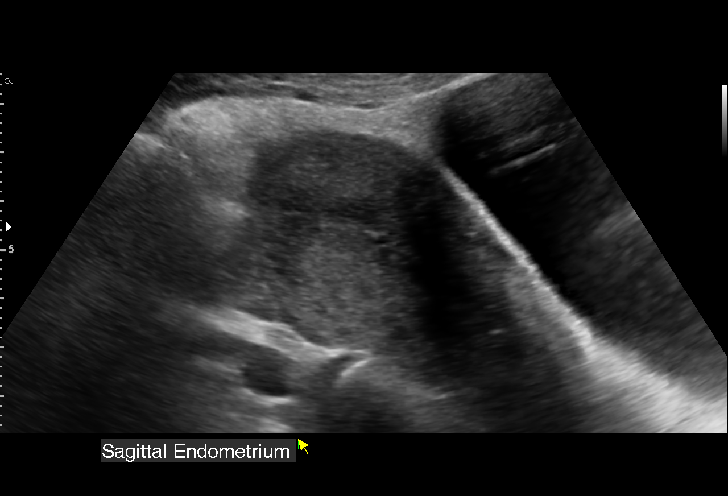
[im 52/104]
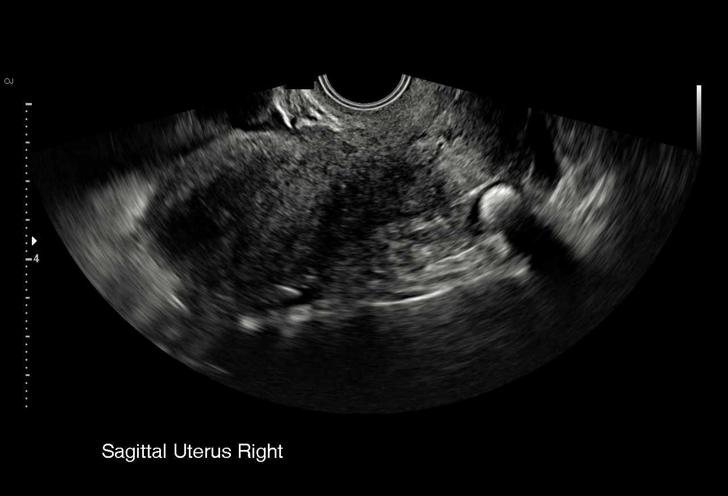
[im 61/104]
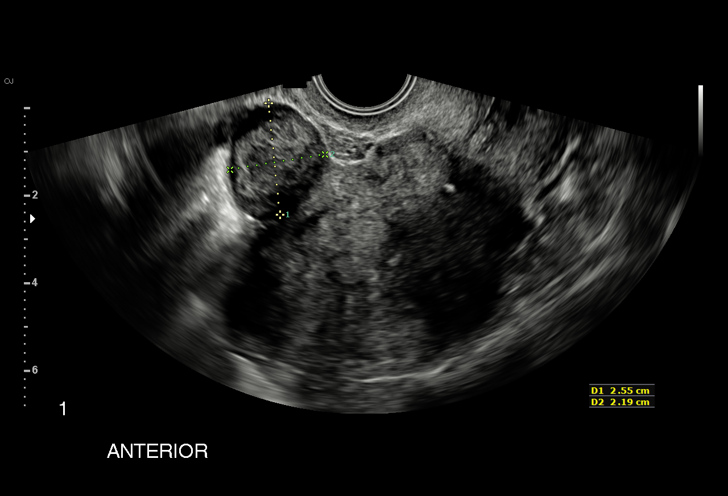
[im 65/104]
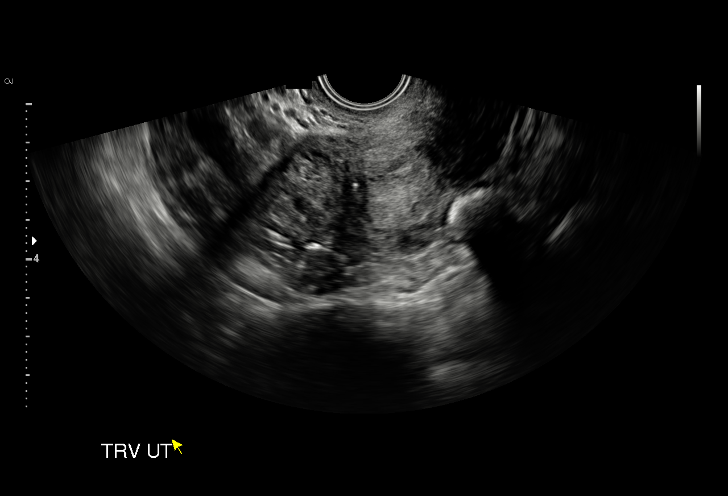
[im 73/104]
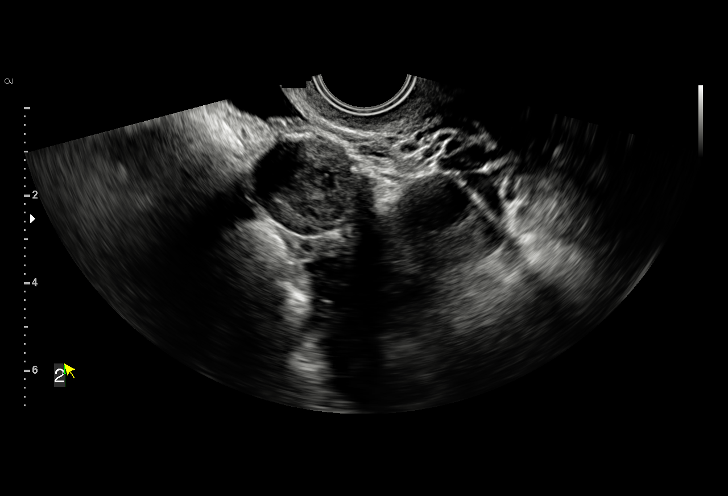
[im 82/104]
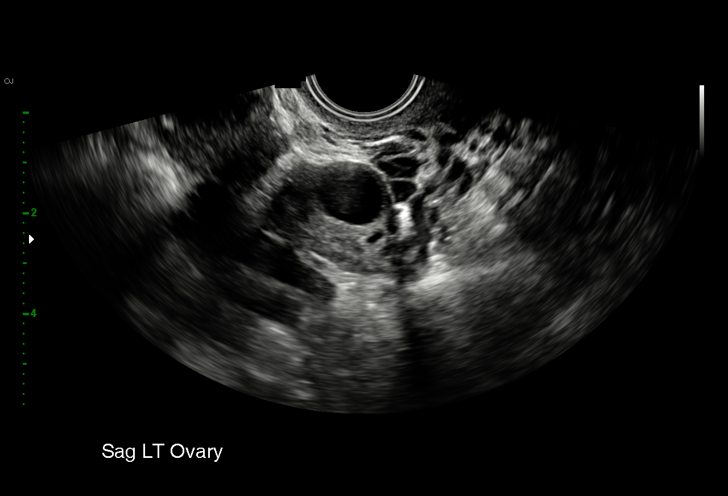
[im 86/104]
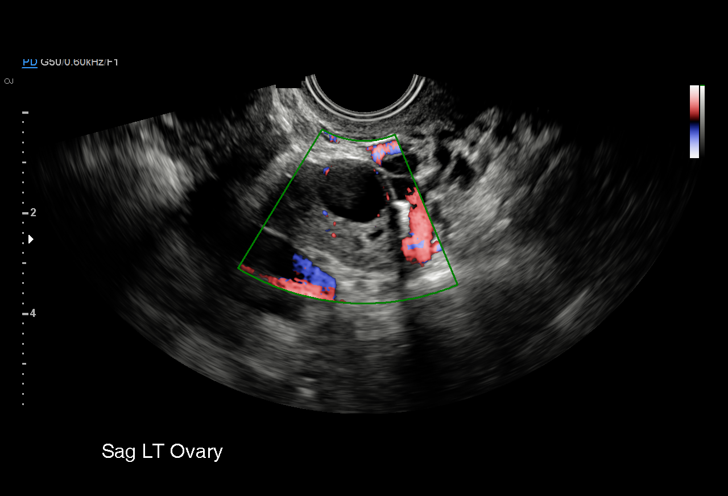
[im 95/104]
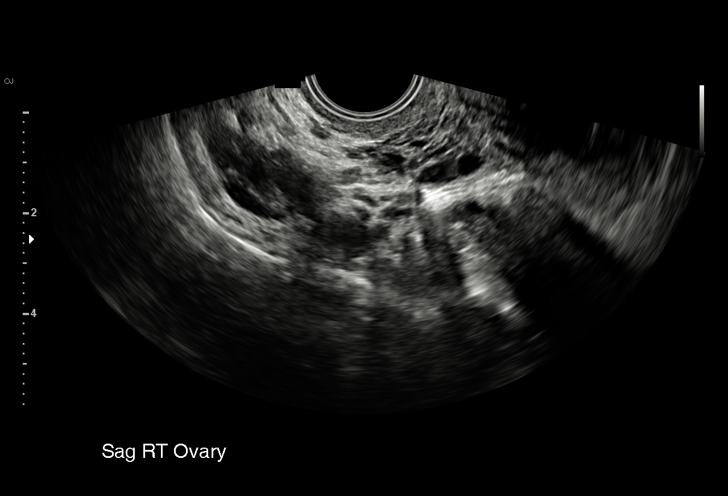
[im 104/104]
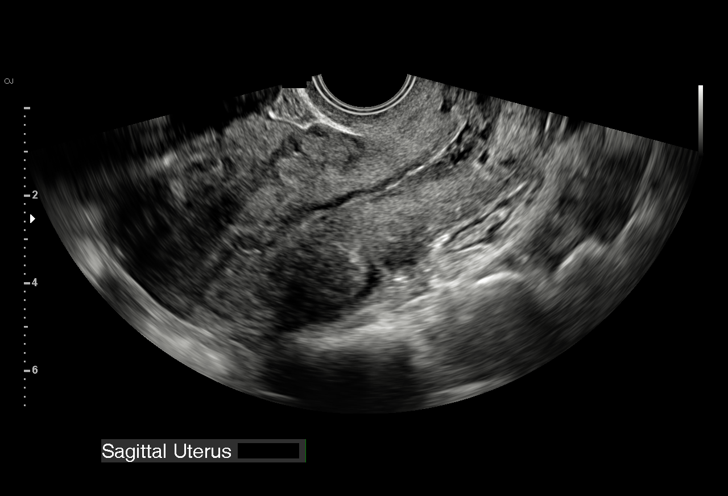

[15 of 25 positions shown; findings below may reference images not displayed]

FINDINGS: Uterus

Measurements: 11.7 x 4.3 x 6.6 cm. Mildly heterogeneous echogenicity
with scattered nodular foci compatible of leiomyomata. Largest of
these lesions measure 2.7 x 2.6 x 2.8 cm subserosal of the posterior
aspect of the upper uterus, subserosal/exophytic anterior LEFT
uterus 2.0 x 2.5 x 3.3 cm, and exophytic/subserosal at anterior
uterus 2.1 x 2.1 x 2.2 cm

Endometrium

Thickness: 4 mm thick, normal. No endometrial fluid or focal
abnormality

Right ovary

Measurements: 3.4 x 2.4 x 2.6 cm. Normal morphology without mass

Left ovary

Measurements: 2.8 x 2.3 x 2.0 cm. Normal morphology without mass

Other findings

Trace free pelvic fluid.

No adnexal masses.
IMPRESSION: Multiple uterine leiomyomata.

Remainder of exam unremarkable.

## 2018-12-28 DIAGNOSIS — Z20828 Contact with and (suspected) exposure to other viral communicable diseases: Secondary | ICD-10-CM | POA: Diagnosis not present

## 2019-07-28 ENCOUNTER — Other Ambulatory Visit: Payer: Self-pay

## 2019-07-28 ENCOUNTER — Ambulatory Visit (INDEPENDENT_AMBULATORY_CARE_PROVIDER_SITE_OTHER): Payer: Medicaid Other | Admitting: Obstetrics

## 2019-07-28 ENCOUNTER — Other Ambulatory Visit (HOSPITAL_COMMUNITY)
Admission: RE | Admit: 2019-07-28 | Discharge: 2019-07-28 | Disposition: A | Discharge status: Home / Self Care | Payer: Medicaid Other | Source: Ambulatory Visit | Attending: Obstetrics | Admitting: Obstetrics

## 2019-07-28 ENCOUNTER — Encounter: Payer: Self-pay | Admitting: Obstetrics

## 2019-07-28 VITALS — BP 113/73 | HR 95 | Ht 66.0 in | Wt 128.0 lb

## 2019-07-28 DIAGNOSIS — N393 Stress incontinence (female) (male): Secondary | ICD-10-CM | POA: Diagnosis not present

## 2019-07-28 DIAGNOSIS — Z01419 Encounter for gynecological examination (general) (routine) without abnormal findings: Secondary | ICD-10-CM | POA: Diagnosis not present

## 2019-07-28 DIAGNOSIS — N841 Polyp of cervix uteri: Secondary | ICD-10-CM | POA: Diagnosis not present

## 2019-07-28 DIAGNOSIS — Z3041 Encounter for surveillance of contraceptive pills: Secondary | ICD-10-CM

## 2019-07-28 DIAGNOSIS — D251 Intramural leiomyoma of uterus: Secondary | ICD-10-CM

## 2019-07-28 DIAGNOSIS — Z1239 Encounter for other screening for malignant neoplasm of breast: Secondary | ICD-10-CM

## 2019-07-28 DIAGNOSIS — N898 Other specified noninflammatory disorders of vagina: Secondary | ICD-10-CM

## 2019-07-28 MED ORDER — FLUCONAZOLE 150 MG PO TABS: 150.0000 mg | ORAL_TABLET | Freq: Once | ORAL | 0 refills | Status: AC

## 2019-07-28 MED ORDER — LEVONORGESTREL-ETHINYL ESTRAD 0.1-20 MG-MCG PO TABS: 1.0000 | ORAL_TABLET | Freq: Every day | ORAL | 11 refills | Status: DC

## 2019-07-28 NOTE — Progress Notes (Signed)
Subjective:        Catherine Estrada is a 44 y.o. female here for a routine exam.  Current complaints: None.    Personal health questionnaire:  Is patient Ashkenazi Jewish, have a family history of breast and/or ovarian cancer: yes Is there a family history of uterine cancer diagnosed at age < 38, gastrointestinal cancer, urinary tract cancer, family member who is a Field seismologist syndrome-associated carrier: no Is the patient overweight and hypertensive, family history of diabetes, personal history of gestational diabetes, preeclampsia or PCOS: no Is patient over 3, have PCOS,  family history of premature CHD under age 44, diabetes, smoke, have hypertension or peripheral artery disease:  no At any time, has a partner hit, kicked or otherwise hurt or frightened you?: no Over the past 2 weeks, have you felt down, depressed or hopeless?: no Over the past 2 weeks, have you felt little interest or pleasure in doing things?:no   Gynecologic History Patient's last menstrual period was 07/02/2019. Contraception: OCP (estrogen/progesterone) Last Pap: 07-24-2018. Results were: normal Last mammogram: 2018. Results were: normal  Obstetric History OB History  Gravida Para Term Preterm AB Living  3 2 2  0 1 2  SAB TAB Ectopic Multiple Live Births  1 0 0 0 2    # Outcome Date GA Lbr Len/2nd Weight Sex Delivery Anes PTL Lv  3 Term 08/25/08    M Vag-Spont EPI N LIV  2 SAB 2010     SAB     1 Term 03/27/06    F Vag-Spont EPI  LIV    Past Medical History:  Diagnosis Date  . Asthma 2014  . Fibroid   . Gastritis     Past Surgical History:  Procedure Laterality Date  . VAGINOPLASTY  2012   had "vaginal tightening" after last delivery     Current Outpatient Medications:  .  albuterol (PROVENTIL HFA;VENTOLIN HFA) 108 (90 Base) MCG/ACT inhaler, Inhale 2 puffs into the lungs every 6 (six) hours as needed for wheezing or shortness of breath., Disp: , Rfl:  .  levonorgestrel-ethinyl estradiol (ALESSE)  0.1-20 MG-MCG tablet, Take 1 tablet by mouth daily., Disp: 28 tablet, Rfl: 11 .  Multiple Vitamin (MULTIVITAMIN) tablet, Take 1 tablet by mouth daily., Disp: , Rfl:  No Known Allergies  Social History   Tobacco Use  . Smoking status: Never Smoker  . Smokeless tobacco: Never Used  Substance Use Topics  . Alcohol use: No    Family History  Problem Relation Age of Onset  . Breast cancer Paternal Aunt 1      Review of Systems  Constitutional: negative for fatigue and weight loss Respiratory: negative for cough and wheezing Cardiovascular: negative for chest pain, fatigue and palpitations Gastrointestinal: negative for abdominal pain and change in bowel habits Musculoskeletal:negative for myalgias Neurological: negative for gait problems and tremors Behavioral/Psych: negative for abusive relationship, depression Endocrine: negative for temperature intolerance    Genitourinary:negative for abnormal menstrual periods, genital lesions, hot flashes, sexual problems and vaginal discharge.  Positive for leaking urine Integument/breast: negative for breast lump, breast tenderness, nipple discharge and skin lesion(s)    Objective:       BP 113/73   Pulse 95   Ht 5\' 6"  (1.676 m)   Wt 128 lb (58.1 kg)   LMP 07/02/2019   BMI 20.66 kg/m  General:   alert and no distress  Skin:   no rash or abnormalities  Lungs:   clear to auscultation bilaterally  Heart:  regular rate and rhythm, S1, S2 normal, no murmur, click, rub or gallop  Breasts:   normal without suspicious masses, skin or nipple changes or axillary nodes  Abdomen:  normal findings: no organomegaly, soft, non-tender and no hernia  Pelvis:  External genitalia: normal general appearance Urinary system: urethral meatus normal and bladder without fullness, nontender Vaginal: normal without tenderness, induration or masses Cervix: normal appearance Adnexa: normal bimanual exam Uterus: anteverted and non-tender, normal size    Lab Review Urine pregnancy test Labs reviewed yes Radiologic studies reviewed yes  50% of 20 min visit spent on counseling and coordination of care.   Assessment:     1. Encounter for gynecological examination with Papanicolaou smear of cervix Rx: - Cytology - PAP( [facility])  2. Polyp at cervical os - removed, intact  3. Fibroids, intramural - clinically stable  4. SUI (stress urinary incontinence, female) - will follow - Kegel's exercises recommended  5. Vaginal discharge Rx: - fluconazole (DIFLUCAN) 150 MG tablet; Take 1 tablet (150 mg total) by mouth once for 1 dose.  Dispense: 1 tablet; Refill: 0  6. Encounter for surveillance of contraceptive pills Rx - levonorgestrel-ethinyl estradiol (ALESSE) 0.1-20 MG-MCG tablet; Take 1 tablet by mouth daily.  Dispense: 28 tablet; Refill: 11  7. Screening breast examination Rx: - MM Digital Screening; Future    Plan:    Education reviewed: calcium supplements, depression evaluation, low fat, low cholesterol diet, safe sex/STD prevention, self breast exams and weight bearing exercise. Contraception: OCP (estrogen/progesterone). Mammogram ordered. Follow up in: 1 year.   Meds ordered this encounter  Medications  . levonorgestrel-ethinyl estradiol (ALESSE) 0.1-20 MG-MCG tablet    Sig: Take 1 tablet by mouth daily.    Dispense:  28 tablet    Refill:  11  . fluconazole (DIFLUCAN) 150 MG tablet    Sig: Take 1 tablet (150 mg total) by mouth once for 1 dose.    Dispense:  1 tablet    Refill:  0   Orders Placed This Encounter  Procedures  . MM Digital Screening    Standing Status:   Future    Standing Expiration Date:   07/27/2020    Order Specific Question:   Reason for Exam (SYMPTOM  OR DIAGNOSIS REQUIRED)    Answer:   Screening    Order Specific Question:   Is the patient pregnant?    Answer:   No    Order Specific Question:   Preferred imaging location?    Answer:   [facility]    Shelly Bombard,  MD 07/29/2019 5:20 AM

## 2019-07-28 NOTE — Progress Notes (Signed)
Patient presents for AEX. Pt complains of having a burning sensation on her vagina when she wipes herself. She denies having any vaginal discharge or odor. No other concerns.  Last pap 07/2018 Normal Last MM: 09/2018 Normal

## 2019-07-30 ENCOUNTER — Other Ambulatory Visit: Payer: Self-pay | Admitting: *Deleted

## 2019-07-30 DIAGNOSIS — Z1231 Encounter for screening mammogram for malignant neoplasm of breast: Secondary | ICD-10-CM

## 2019-08-01 LAB — CYTOLOGY - PAP
Comment: NEGATIVE
High risk HPV: NEGATIVE

## 2019-10-16 ENCOUNTER — Other Ambulatory Visit: Payer: Self-pay

## 2019-10-16 ENCOUNTER — Ambulatory Visit: Payer: Self-pay | Admitting: *Deleted

## 2019-10-16 ENCOUNTER — Ambulatory Visit
Admission: RE | Admit: 2019-10-16 | Discharge: 2019-10-16 | Disposition: A | Discharge status: Home / Self Care | Payer: No Typology Code available for payment source | Source: Ambulatory Visit | Attending: Obstetrics and Gynecology | Admitting: Obstetrics and Gynecology

## 2019-10-16 VITALS — BP 120/70 | Temp 97.7°F | Wt 127.2 lb

## 2019-10-16 DIAGNOSIS — Z1239 Encounter for other screening for malignant neoplasm of breast: Secondary | ICD-10-CM

## 2019-10-16 DIAGNOSIS — R87612 Low grade squamous intraepithelial lesion on cytologic smear of cervix (LGSIL): Secondary | ICD-10-CM

## 2019-10-16 DIAGNOSIS — Z1231 Encounter for screening mammogram for malignant neoplasm of breast: Secondary | ICD-10-CM

## 2019-10-16 NOTE — Progress Notes (Signed)
Ms. Syrita Dovel is a 44 y.o. female who presents to [facility] clinic today with no complaints.    Pap Smear: Pap smear not completed today. Last Pap smear was 07/28/2019 at [facility] [place] [facility] and was LGSIL with negative HPV. Per patient has no history of an abnormal Pap smear prior to her most recent Pap smear. Last Pap smear result is available in Epic.   Physical exam: Breasts Breasts symmetrical. No skin abnormalities bilateral breasts. No nipple retraction bilateral breasts. No nipple discharge bilateral breasts. No lymphadenopathy. No lumps palpated bilateral breasts. No complaints of pain or tenderness on exam.       Pelvic/Bimanual Pap is not indicated today per BCCCP guidelines.   Smoking History: Patient has never smoked.   Patient Navigation: Patient education provided. Access to services provided for patient through BCCCP program.   Breast and Cervical Cancer Risk Assessment: Patient has family history of a paternal aunt having breast cancer. Patient has no known genetic mutations or history of radiation treatment to the chest before age 59. Patient does not have history of cervical dysplasia, immunocompromised, or DES exposure in-utero.  Risk Assessment    Risk Scores      10/16/2019 10/10/2018   Last edited by: Demetrius Revel, LPN Armond Hang, LPN   5-year risk: 0.8 % 0.7 %   Lifetime risk: 9.4 % 9.5 %          A: BCCCP exam without pap smear Patient referred to [facility] by the [facility] for [place] at [facility] for a colposcopy to follow-up for her abnormal Pap smear completed on 07/28/2019.  P: Referred patient to the [facility] for a screening mammogram on the mobile unit. Appointment scheduled Thursday, October 16, 2019 at 1100.  Referred patient to the [facility] for [place] at [facility] for a colposcopy. Appointment scheduled Friday, October 31, 2019 at [place].  Loletta Parish, RN 10/16/2019 12:58 PM

## 2019-10-16 NOTE — Patient Instructions (Signed)
Explained breast self awareness with [name]. Patient did not need a Pap smear today due to last Pap smear was 07/28/2019. Explained the colposcopy the recommended follow-up for her abnormal Pap smear. Referred patient to the [facility] for [place] at [facility] for a colposcopy. Appointment scheduled Friday, October 31, 2019 at [place]. Referred patient to the [facility] for a screening mammogram on the mobile unit. Appointment scheduled Thursday, October 16, 2019 at 1100. Patient escorted to the mobile unit for her screening mammogram following her BCCCP appointment. Let patient know the [facility] will follow up with her within the next couple weeks with results of her mammogram by letter or phone. [name] verbalized understanding.  Rilyn Scroggs, Arvil Chaco, RN 12:59 PM

## 2019-10-21 ENCOUNTER — Other Ambulatory Visit: Payer: Self-pay | Admitting: Obstetrics and Gynecology

## 2019-10-21 DIAGNOSIS — R928 Other abnormal and inconclusive findings on diagnostic imaging of breast: Secondary | ICD-10-CM

## 2019-10-24 ENCOUNTER — Ambulatory Visit
Admission: RE | Admit: 2019-10-24 | Discharge: 2019-10-24 | Disposition: A | Discharge status: Home / Self Care | Payer: No Typology Code available for payment source | Source: Ambulatory Visit | Attending: Obstetrics and Gynecology | Admitting: Obstetrics and Gynecology

## 2019-10-24 ENCOUNTER — Other Ambulatory Visit: Payer: Self-pay

## 2019-10-24 DIAGNOSIS — R928 Other abnormal and inconclusive findings on diagnostic imaging of breast: Secondary | ICD-10-CM

## 2019-10-28 ENCOUNTER — Encounter: Payer: No Typology Code available for payment source | Admitting: Obstetrics

## 2019-10-31 ENCOUNTER — Other Ambulatory Visit: Payer: Self-pay

## 2019-10-31 ENCOUNTER — Encounter: Payer: Self-pay | Admitting: Obstetrics

## 2019-10-31 ENCOUNTER — Other Ambulatory Visit (HOSPITAL_COMMUNITY)
Admission: RE | Admit: 2019-10-31 | Discharge: 2019-10-31 | Disposition: A | Discharge status: Home / Self Care | Payer: Medicaid Other | Source: Ambulatory Visit | Attending: Obstetrics | Admitting: Obstetrics

## 2019-10-31 ENCOUNTER — Ambulatory Visit (INDEPENDENT_AMBULATORY_CARE_PROVIDER_SITE_OTHER): Payer: Self-pay | Admitting: Obstetrics

## 2019-10-31 VITALS — BP 118/76 | HR 90 | Wt 128.0 lb

## 2019-10-31 DIAGNOSIS — Z01812 Encounter for preprocedural laboratory examination: Secondary | ICD-10-CM

## 2019-10-31 DIAGNOSIS — R87612 Low grade squamous intraepithelial lesion on cytologic smear of cervix (LGSIL): Secondary | ICD-10-CM | POA: Diagnosis not present

## 2019-10-31 LAB — POCT URINE PREGNANCY: Preg Test, Ur: NEGATIVE

## 2019-10-31 NOTE — Progress Notes (Signed)
Colposcopy Procedure Note  Indications: Pap smear 3 months ago showed: low-grade squamous intraepithelial neoplasia (LGSIL - encompassing HPV,mild dysplasia,CIN I). The prior pap showed no abnormalities.  Prior cervical/vaginal disease: normal exam without visible pathology. Prior cervical treatment: no treatment.  Procedure Details  The risks and benefits of the procedure and Written informed consent obtained.  A time-out was performed confirming the patient, procedure and allergy status  Speculum placed in vagina and excellent visualization of cervix achieved, cervix swabbed x 3 with acetic acid solution.  Findings: Cervix: no visible lesions, no mosaicism, no punctation and no abnormal vasculature; SCJ visualized 360 degrees without lesions, endocervical curettage performed, cervical biopsies taken at 6 and 12 o'clock, specimen labelled and sent to pathology and hemostasis achieved with silver nitrate.   Vaginal inspection: normal without visible lesions. Vulvar colposcopy: vulvar colposcopy not performed.   Physical Exam   Specimens: ECC and Cervical Biopsies  Complications: none.  Plan: Specimens labelled and sent to Pathology. Will base further treatment on Pathology findings. Treatment options discussed with patient. Post biopsy instructions given to patient. Return to discuss Pathology results in 2 weeks.  Shelly Bombard, MD 10/31/2019 9:58 AM

## 2019-10-31 NOTE — Addendum Note (Signed)
Addended by: Baltazar Najjar A on: 10/31/2019 11:40 AM   Modules accepted: Orders

## 2019-10-31 NOTE — Addendum Note (Signed)
Addended by: Lewie Loron D on: 10/31/2019 11:29 AM   Modules accepted: Orders

## 2019-11-03 LAB — SURGICAL PATHOLOGY

## 2019-11-05 ENCOUNTER — Telehealth: Payer: Self-pay

## 2019-11-05 NOTE — Telephone Encounter (Signed)
-----   Message from Shelly Bombard, MD sent at 11/03/2019  9:12 PM EDT ----- LGSIL on colposcopic biopsies.  Repeat pap in 1 year.

## 2019-11-05 NOTE — Telephone Encounter (Signed)
Pt notified of colpo results and provider advice to repeat pap in 1 yr Pt voiced understanding  has F/U colpo appt coming up Pt advised to keep appt if she has any other questions or concerns regarding results.

## 2019-11-17 ENCOUNTER — Encounter: Payer: Self-pay | Admitting: Obstetrics

## 2019-11-17 ENCOUNTER — Telehealth (INDEPENDENT_AMBULATORY_CARE_PROVIDER_SITE_OTHER): Payer: No Typology Code available for payment source | Admitting: Obstetrics

## 2019-11-17 ENCOUNTER — Other Ambulatory Visit: Payer: Self-pay

## 2019-11-17 DIAGNOSIS — N870 Mild cervical dysplasia: Secondary | ICD-10-CM

## 2019-11-17 NOTE — Progress Notes (Signed)
GYNECOLOGY VIRTUAL VISIT ENCOUNTER NOTE  Provider location: [facility] [place]   I connected with Catherine Estrada on 11/17/19 at  9:30 AM EDT by MyChart Video Encounter at home and verified that I am speaking with the correct person using two identifiers.   I discussed the limitations, risks, security and privacy concerns of performing an evaluation and management service virtually and the availability of in person appointments. I also discussed with the patient that there may be a patient responsible charge related to this service. The patient expressed understanding and agreed to proceed.   History:  Catherine Estrada is a 44 y.o. [phone] female being evaluated today for results of colposcopic biopsies and management recommendations.  Pap smear was LGSIL.  She denies any abnormal vaginal discharge, bleeding, pelvic pain or other concerns.       Past Medical History:  Diagnosis Date  . Asthma 2014  . Fibroid   . Gastritis    Past Surgical History:  Procedure Laterality Date  . VAGINOPLASTY  2012   had "vaginal tightening" after last delivery   The following portions of the patient's history were reviewed and updated as appropriate: allergies, current medications, past family history, past medical history, past social history, past surgical history and problem list.   Health Maintenance:  Pap smear:  LGSIL  Review of Systems:  Pertinent items noted in HPI and remainder of comprehensive ROS otherwise negative.  Physical Exam:   General:  Alert, oriented and cooperative. Patient appears to be in no acute distress.  Mental Status: Normal mood and affect. Normal behavior. Normal judgment and thought content.   Respiratory: Normal respiratory effort, no problems with respiration noted  Rest of physical exam deferred due to type of encounter  Labs and Imaging No results found for this or any previous visit (from the past 336 hour(s)). US BREAST LTD UNI RIGHT INC  AXILLA  Result Date: 10/24/2019 CLINICAL DATA:  Patient is recalled after screening study for evaluation of possible RIGHT breast mass. EXAM: DIGITAL DIAGNOSTIC RIGHT MAMMOGRAM WITH CAD AND TOMO ULTRASOUND RIGHT BREAST COMPARISON:  Previous exam(s). ACR Breast Density Category d: The breast tissue is extremely dense, which lowers the sensitivity of mammography. FINDINGS: Additional 2-D and 3-D images are performed. These views show possible persistent mass in the posterior central portion of the RIGHT breast only on the craniocaudal view. Mammographic images were processed with CAD. Targeted ultrasound is performed, showing normal appearing dense fibroglandular tissue in the retroareolar region of the RIGHT breast. No suspicious mass, distortion, or acoustic shadowing is demonstrated with ultrasound. Subsequent spot rolled views are performed, showing no persistent mass or asymmetry in the posterior aspect of the breast. IMPRESSION: No mammographic or ultrasound evidence for malignancy. RECOMMENDATION: Screening mammogram in one year.(Code:SM-B-01Y) I have discussed the findings and recommendations with the patient. If applicable, a reminder letter will be sent to the patient regarding the next appointment. BI-RADS CATEGORY  1: Negative. Electronically Signed   By: Nolon Nations M.D.   On: 10/24/2019 13:41   MS DIGITAL DIAG TOMO UNI RIGHT  Result Date: 10/24/2019 CLINICAL DATA:  Patient is recalled after screening study for evaluation of possible RIGHT breast mass. EXAM: DIGITAL DIAGNOSTIC RIGHT MAMMOGRAM WITH CAD AND TOMO ULTRASOUND RIGHT BREAST COMPARISON:  Previous exam(s). ACR Breast Density Category d: The breast tissue is extremely dense, which lowers the sensitivity of mammography. FINDINGS: Additional 2-D and 3-D images are performed. These views show possible persistent mass in the posterior central  portion of the RIGHT breast only on the craniocaudal view. Mammographic images were processed with  CAD. Targeted ultrasound is performed, showing normal appearing dense fibroglandular tissue in the retroareolar region of the RIGHT breast. No suspicious mass, distortion, or acoustic shadowing is demonstrated with ultrasound. Subsequent spot rolled views are performed, showing no persistent mass or asymmetry in the posterior aspect of the breast. IMPRESSION: No mammographic or ultrasound evidence for malignancy. RECOMMENDATION: Screening mammogram in one year.(Code:SM-B-01Y) I have discussed the findings and recommendations with the patient. If applicable, a reminder letter will be sent to the patient regarding the next appointment. BI-RADS CATEGORY  1: Negative. Electronically Signed   By: Nolon Nations M.D.   On: 10/24/2019 13:41       Assessment and Plan:     1. Dysplasia of cervix, low grade (CIN 1) - repeat pap in 1 year       I discussed the assessment and treatment plan with the patient. The patient was provided an opportunity to ask questions and all were answered. The patient agreed with the plan and demonstrated an understanding of the instructions.   The patient was advised to call back or seek an in-person evaluation/go to the ED if the symptoms worsen or if the condition fails to improve as anticipated.  I provided 15 minutes of face-to-face time during this encounter.   Baltazar Najjar, MD [facility], [facility] 11/17/19

## 2019-11-17 NOTE — Progress Notes (Signed)
I connected with  Catherine Estrada on 11/17/19 by a video enabled telemedicine application and verified that I am speaking with the correct person using two identifiers.  Patient is at home Provider is at [facility]   I discussed the limitations of evaluation and management by telemedicine. The patient expressed understanding and agreed to proceed.  GYN FU after COLPO, she reports no problems today

## 2020-03-25 DIAGNOSIS — J209 Acute bronchitis, unspecified: Secondary | ICD-10-CM | POA: Diagnosis not present

## 2020-03-30 DIAGNOSIS — H9113 Presbycusis, bilateral: Secondary | ICD-10-CM | POA: Diagnosis not present

## 2020-03-30 DIAGNOSIS — Z0000 Encounter for general adult medical examination without abnormal findings: Secondary | ICD-10-CM | POA: Diagnosis not present

## 2020-03-30 DIAGNOSIS — D485 Neoplasm of uncertain behavior of skin: Secondary | ICD-10-CM | POA: Diagnosis not present

## 2020-03-30 DIAGNOSIS — R5383 Other fatigue: Secondary | ICD-10-CM | POA: Diagnosis not present

## 2020-03-30 DIAGNOSIS — R42 Dizziness and giddiness: Secondary | ICD-10-CM | POA: Diagnosis not present

## 2020-03-30 DIAGNOSIS — E7800 Pure hypercholesterolemia, unspecified: Secondary | ICD-10-CM | POA: Diagnosis not present

## 2020-03-30 DIAGNOSIS — Z131 Encounter for screening for diabetes mellitus: Secondary | ICD-10-CM | POA: Diagnosis not present

## 2020-03-30 DIAGNOSIS — R1032 Left lower quadrant pain: Secondary | ICD-10-CM | POA: Diagnosis not present

## 2020-03-30 DIAGNOSIS — I70219 Atherosclerosis of native arteries of extremities with intermittent claudication, unspecified extremity: Secondary | ICD-10-CM | POA: Diagnosis not present

## 2020-03-30 DIAGNOSIS — I10 Essential (primary) hypertension: Secondary | ICD-10-CM | POA: Diagnosis not present

## 2020-03-30 DIAGNOSIS — R002 Palpitations: Secondary | ICD-10-CM | POA: Diagnosis not present

## 2020-03-30 DIAGNOSIS — E039 Hypothyroidism, unspecified: Secondary | ICD-10-CM | POA: Diagnosis not present

## 2020-12-15 ENCOUNTER — Other Ambulatory Visit (HOSPITAL_COMMUNITY)
Admission: RE | Admit: 2020-12-15 | Discharge: 2020-12-15 | Disposition: A | Discharge status: Home / Self Care | Payer: Medicaid Other | Source: Ambulatory Visit | Attending: Obstetrics | Admitting: Obstetrics

## 2020-12-15 ENCOUNTER — Encounter: Payer: Self-pay | Admitting: Obstetrics

## 2020-12-15 ENCOUNTER — Other Ambulatory Visit: Payer: Self-pay

## 2020-12-15 ENCOUNTER — Ambulatory Visit (INDEPENDENT_AMBULATORY_CARE_PROVIDER_SITE_OTHER): Payer: Medicaid Other | Admitting: Obstetrics

## 2020-12-15 VITALS — BP 127/83 | HR 84 | Ht 65.0 in | Wt 129.6 lb

## 2020-12-15 DIAGNOSIS — Z01419 Encounter for gynecological examination (general) (routine) without abnormal findings: Secondary | ICD-10-CM

## 2020-12-15 DIAGNOSIS — N939 Abnormal uterine and vaginal bleeding, unspecified: Secondary | ICD-10-CM

## 2020-12-15 DIAGNOSIS — N898 Other specified noninflammatory disorders of vagina: Secondary | ICD-10-CM

## 2020-12-15 DIAGNOSIS — Z30011 Encounter for initial prescription of contraceptive pills: Secondary | ICD-10-CM

## 2020-12-15 DIAGNOSIS — Z113 Encounter for screening for infections with a predominantly sexual mode of transmission: Secondary | ICD-10-CM

## 2020-12-15 DIAGNOSIS — R102 Pelvic and perineal pain: Secondary | ICD-10-CM

## 2020-12-15 DIAGNOSIS — G43009 Migraine without aura, not intractable, without status migrainosus: Secondary | ICD-10-CM

## 2020-12-15 DIAGNOSIS — Z3009 Encounter for other general counseling and advice on contraception: Secondary | ICD-10-CM

## 2020-12-15 LAB — POCT URINALYSIS DIPSTICK
Bilirubin, UA: NEGATIVE
Blood, UA: NEGATIVE
Glucose, UA: NEGATIVE
Ketones, UA: NEGATIVE
Leukocytes, UA: NEGATIVE
Nitrite, UA: NEGATIVE
Protein, UA: NEGATIVE
Spec Grav, UA: 1.01 (ref 1.010–1.025)
Urobilinogen, UA: 0.2 E.U./dL
pH, UA: 6 (ref 5.0–8.0)

## 2020-12-15 LAB — POCT URINE PREGNANCY: Preg Test, Ur: NEGATIVE

## 2020-12-15 MED ORDER — SLYND 4 MG PO TABS: 1.0000 | ORAL_TABLET | Freq: Every day | ORAL | 11 refills | Status: DC

## 2020-12-15 NOTE — Progress Notes (Signed)
Subjective:        Catherine Estrada is a 45 y.o. female here for a routine exam.  Current complaints: Vaginal discharge.    Personal health questionnaire:  Is patient Ashkenazi Jewish, have a family history of breast and/or ovarian cancer: yes Is there a family history of uterine cancer diagnosed at age < 80, gastrointestinal cancer, urinary tract cancer, family member who is a Field seismologist syndrome-associated carrier: no Is the patient overweight and hypertensive, family history of diabetes, personal history of gestational diabetes, preeclampsia or PCOS: no Is patient over 44, have PCOS,  family history of premature CHD under age 71, diabetes, smoke, have hypertension or peripheral artery disease:  no At any time, has a partner hit, kicked or otherwise hurt or frightened you?: no Over the past 2 weeks, have you felt down, depressed or hopeless?: no Over the past 2 weeks, have you felt little interest or pleasure in doing things?:no   Gynecologic History Patient's last menstrual period was 11/24/2020. Contraception: none.  Stopped OCP's because of headaches. Last Pap: 10-31-2019. Results were: LGSIL Last mammogram: 2022. Results were: normal  Obstetric History OB History  Gravida Para Term Preterm AB Living  3 2 2  0 1 2  SAB IAB Ectopic Multiple Live Births  1 0 0 0 2    # Outcome Date GA Lbr Len/2nd Weight Sex Delivery Anes PTL Lv  3 Term 08/25/08    M Vag-Spont EPI N LIV  2 SAB 2010     SAB     1 Term 03/27/06    F Vag-Spont EPI  LIV    Past Medical History:  Diagnosis Date   Asthma 2014   Fibroid    Gastritis     Past Surgical History:  Procedure Laterality Date   VAGINOPLASTY  2012   had "vaginal tightening" after last delivery     Current Outpatient Medications:    Multiple Vitamin (MULTIVITAMIN) tablet, Take 1 tablet by mouth daily., Disp: , Rfl:    albuterol (PROVENTIL HFA;VENTOLIN HFA) 108 (90 Base) MCG/ACT inhaler, Inhale 2 puffs into the lungs every 6 (six) hours  as needed for wheezing or shortness of breath. (Patient not taking: Reported on 12/15/2020), Disp: , Rfl:    levonorgestrel-ethinyl estradiol (ALESSE) 0.1-20 MG-MCG tablet, Take 1 tablet by mouth daily. (Patient not taking: Reported on 12/15/2020), Disp: 28 tablet, Rfl: 11 No Known Allergies  Social History   Tobacco Use   Smoking status: Never   Smokeless tobacco: Never  Substance Use Topics   Alcohol use: No    Family History  Problem Relation Age of Onset   Breast cancer Paternal Aunt 60   Hypertension Mother    Hypertension Father       Review of Systems  Constitutional: negative for fatigue and weight loss Respiratory: negative for cough and wheezing Cardiovascular: negative for chest pain, fatigue and palpitations Gastrointestinal: negative for abdominal pain and change in bowel habits Musculoskeletal:negative for myalgias Neurological: negative for gait problems and tremors Behavioral/Psych: negative for abusive relationship, depression Endocrine: negative for temperature intolerance    Genitourinary: positive for vaginal discharge, pelvic pain and prolonged and heavy periods.  negative for abnormal menstrual periods, genital lesions, hot flashes, sexual problems  Integument/breast: negative for breast lump, breast tenderness, nipple discharge and skin lesion(s)    Objective:       BP 127/83   Pulse 84   Ht 5\' 5"  (1.651 m)   Wt 129 lb 9.6 oz (58.8 kg)   LMP  11/24/2020   BMI 21.57 kg/m  General:   Alert and no distress  Skin:   no rash or abnormalities  Lungs:   clear to auscultation bilaterally  Heart:   regular rate and rhythm, S1, S2 normal, no murmur, click, rub or gallop  Breasts:   normal without suspicious masses, skin or nipple changes or axillary nodes  Abdomen:  normal findings: no organomegaly, soft, non-tender and no hernia  Pelvis:  External genitalia: normal general appearance Urinary system: urethral meatus normal and bladder without fullness,  nontender Vaginal: normal without tenderness, induration or masses Cervix: normal appearance Adnexa: normal bimanual exam Uterus: anteverted and non-tender, normal size   Lab Review Urine pregnancy test Labs reviewed yes Radiologic studies reviewed yes  I have spent a total of 20 minutes of face-to-face time, excluding clinical staff time, reviewing notes and preparing to see patient, ordering tests and/or medications, and counseling the patient.    Assessment:    1. Encounter for routine gynecological examination with Papanicolaou smear of cervix Rx: - Cytology - PAP( [facility])  2. Vaginal discharge Rx: - Cervicovaginal ancillary only( [place])  3. Screening for STD (sexually transmitted disease) Rx: - Hepatitis B surface antigen - Hepatitis C antibody - RPR - HIV Antibody (routine testing w rflx)  4. Abnormal uterine bleeding (AUB) - history of intramural / subserosal fibroids  5. Pelvic pain Rx: - US PELVIC COMPLETE WITH TRANSVAGINAL; Future  6. Encounter for other general counseling and advice on contraception - considering Depo vs Slynd because of migraines   7. Encounter for initial prescription of contraceptive pills Rx: - Drospirenone (SLYND) 4 MG TABS; Take 1 tablet by mouth daily before breakfast.  Dispense: 28 tablet; Refill: 11  8. Migraine without aura and without status migrainosus, not intractable - clinically stable      Plan:    Education reviewed: calcium supplements, depression evaluation, low fat, low cholesterol diet, safe sex/STD prevention, self breast exams, and weight bearing exercise. Contraception: considering progestin-only contraceptives. Follow up in: as needed  time .     Shelly Bombard, MD 12/15/2020 1:15 PM

## 2020-12-15 NOTE — Progress Notes (Signed)
Pt presents for annual, pap, and all STD testing.  Pt c/o prolonged, heavy cycles and low abdominal pain since stopping BCP summer 2022. Normal mamm[place] 06/2020 in [place] per [place]  Last pap 07/28/19 LSIL

## 2020-12-16 LAB — CERVICOVAGINAL ANCILLARY ONLY
Bacterial Vaginitis (gardnerella): POSITIVE — AB
Candida Glabrata: NEGATIVE
Candida Vaginitis: NEGATIVE
Chlamydia: NEGATIVE
Comment: NEGATIVE
Comment: NEGATIVE
Comment: NEGATIVE
Comment: NEGATIVE
Comment: NEGATIVE
Comment: NORMAL
Neisseria Gonorrhea: NEGATIVE
Trichomonas: NEGATIVE

## 2020-12-16 LAB — HIV ANTIBODY (ROUTINE TESTING W REFLEX): HIV Screen 4th Generation wRfx: NONREACTIVE

## 2020-12-16 LAB — HEPATITIS B SURFACE ANTIGEN: Hepatitis B Surface Ag: NEGATIVE

## 2020-12-16 LAB — HEPATITIS C ANTIBODY: Hep C Virus Ab: 0.1 s/co ratio (ref 0.0–0.9)

## 2020-12-16 LAB — RPR: RPR Ser Ql: NONREACTIVE

## 2020-12-17 ENCOUNTER — Other Ambulatory Visit: Payer: Self-pay | Admitting: Obstetrics

## 2020-12-17 DIAGNOSIS — B9689 Other specified bacterial agents as the cause of diseases classified elsewhere: Secondary | ICD-10-CM

## 2020-12-17 DIAGNOSIS — N760 Acute vaginitis: Secondary | ICD-10-CM

## 2020-12-17 LAB — CYTOLOGY - PAP
Comment: NEGATIVE
Diagnosis: NEGATIVE
Diagnosis: REACTIVE
High risk HPV: NEGATIVE

## 2020-12-17 MED ORDER — METRONIDAZOLE 500 MG PO TABS: 500.0000 mg | ORAL_TABLET | Freq: Two times a day (BID) | ORAL | 2 refills | Status: DC

## 2020-12-22 ENCOUNTER — Ambulatory Visit (HOSPITAL_BASED_OUTPATIENT_CLINIC_OR_DEPARTMENT_OTHER): Payer: Medicaid Other

## 2020-12-29 ENCOUNTER — Telehealth (INDEPENDENT_AMBULATORY_CARE_PROVIDER_SITE_OTHER): Payer: No Typology Code available for payment source | Admitting: Obstetrics

## 2020-12-29 ENCOUNTER — Encounter: Payer: Self-pay | Admitting: Obstetrics

## 2020-12-29 DIAGNOSIS — R102 Pelvic and perineal pain: Secondary | ICD-10-CM

## 2020-12-29 NOTE — Progress Notes (Signed)
Appointment cancelled.  Patient did not go for ultrasound.    A/P:  Pelvic pain.  Will reschedule ultrasound and follow up MyChart appointment.  Shelly Bombard, MD 12/29/2020 11:07 AM

## 2020-12-30 ENCOUNTER — Other Ambulatory Visit: Payer: Self-pay

## 2020-12-30 ENCOUNTER — Ambulatory Visit (HOSPITAL_BASED_OUTPATIENT_CLINIC_OR_DEPARTMENT_OTHER)
Admission: RE | Admit: 2020-12-30 | Discharge: 2020-12-30 | Disposition: A | Discharge status: Home / Self Care | Payer: Medicaid Other | Source: Ambulatory Visit | Attending: Obstetrics | Admitting: Obstetrics

## 2020-12-30 DIAGNOSIS — R102 Pelvic and perineal pain: Secondary | ICD-10-CM | POA: Diagnosis not present

## 2021-01-13 ENCOUNTER — Telehealth (INDEPENDENT_AMBULATORY_CARE_PROVIDER_SITE_OTHER): Payer: Medicaid Other | Admitting: Obstetrics and Gynecology

## 2021-01-13 ENCOUNTER — Other Ambulatory Visit: Payer: Self-pay | Admitting: Obstetrics and Gynecology

## 2021-01-13 ENCOUNTER — Other Ambulatory Visit: Payer: Self-pay

## 2021-01-13 DIAGNOSIS — Z1231 Encounter for screening mammogram for malignant neoplasm of breast: Secondary | ICD-10-CM

## 2021-01-13 DIAGNOSIS — D259 Leiomyoma of uterus, unspecified: Secondary | ICD-10-CM | POA: Diagnosis not present

## 2021-01-13 DIAGNOSIS — D219 Benign neoplasm of connective and other soft tissue, unspecified: Secondary | ICD-10-CM | POA: Diagnosis not present

## 2021-01-13 DIAGNOSIS — R102 Pelvic and perineal pain: Secondary | ICD-10-CM

## 2021-01-13 NOTE — Progress Notes (Unsigned)
ma

## 2021-01-13 NOTE — Progress Notes (Signed)
CC: ultrasound follow up for fibroids GYNECOLOGY VIRTUAL VISIT ENCOUNTER NOTE  Provider location: [facility]   Patient location: Home  I connected with [name] on 01/13/21 at  2:30 PM EST by MyChart Video Encounter and verified that I am speaking with the correct person using two identifiers.   I discussed the limitations, risks, security and privacy concerns of performing an evaluation and management service virtually and the availability of in person appointments. I also discussed with the patient that there may be a patient responsible charge related to this service. The patient expressed understanding and agreed to proceed.   History:  Catherine Estrada is a 45 y.o. [phone] female being evaluated today for ultrasound follow up for fibroid uterus. She denies any abnormal vaginal discharge, bleeding, or other concerns.    Discussed ultrasound findings with patient.  Slightly enlarged uterus noted.  Three primary fibroids noted.  Most were subserosal and intramural.  Pt has restarted OCP x 1 cycle with improvement in menstrual bleeding.  Menses were 10 days , but were 5 days on last cycle.  Some pelvic pain still noted, but seems to be improved.     Past Medical History:  Diagnosis Date   Asthma 2014   Fibroid    Gastritis    Past Surgical History:  Procedure Laterality Date   VAGINOPLASTY  2012   had "vaginal tightening" after last delivery   The following portions of the patient's history were reviewed and updated as appropriate: allergies, current medications, past family history, past medical history, past social history, past surgical history and problem list.   Health Maintenance:  Normal pap and negative HRHPV on 12/15/20.  Normal mammogram on 11/2016.   Review of Systems:  Pertinent items noted in HPI and remainder of comprehensive ROS otherwise negative.  Physical Exam:   General:  Alert, oriented and cooperative. Patient appears to be  in no acute distress.  Mental Status: Normal mood and affect. Normal behavior. Normal judgment and thought content.   Respiratory: Normal respiratory effort, no problems with respiration noted  Rest of physical exam deferred due to type of encounter  Labs and Imaging No results found for this or any previous visit (from the past 336 hour(s)). US PELVIC COMPLETE WITH TRANSVAGINAL  Result Date: 12/30/2020 CLINICAL DATA:  Pelvic pain since prior ultrasound in 2018, LMP 10/24/2020 EXAM: TRANSABDOMINAL AND TRANSVAGINAL ULTRASOUND OF PELVIS TECHNIQUE: Both transabdominal and transvaginal ultrasound examinations of the pelvis were performed. Transabdominal technique was performed for global imaging of the pelvis including uterus, ovaries, adnexal regions, and pelvic cul-de-sac. It was necessary to proceed with endovaginal exam following the transabdominal exam to visualize the uterus, endometrium, and ovaries. COMPARISON:  12/06/2016 FINDINGS: Uterus Measurements: 11.7 x 5.2 x 8.2 cm = volume: 263 mL. Anteverted. Heterogeneous myometrium. Multiple nodular areas consistent with leiomyomata. These include an exophytic leiomyoma at anterior fundus 3.3 cm greatest diameter, subserosal leiomyoma posterior upper uterus 4.7 cm greatest diameter, and exophytic LEFT lateral leiomyoma 3.9 cm diameter. Endometrium Thickness: 10 mm.  No endometrial fluid or mass Right ovary Measurements: 3.0 x 2.6 x 2.4 cm = volume: 9.6 mL. Normal morphology without mass Left ovary Measurements: 3.0 x 1.5 x 3.2 cm = volume: 7.6 mL. Normal morphology without mass Other findings No free pelvic fluid.  No adnexal masses. IMPRESSION: Multiple uterine leiomyomata as above. Otherwise negative exam. Electronically Signed   By: Lavonia Dana M.D.   On: 12/30/2020 14:53  Assessment and Plan:     Fibroid uterus:  uterine bleeding seems to have decreased with initiation of new OCP.  Would continue medication indefinitely at this time.  If  ineffective would consider progesterone IUD, oral TXA, Sonata procedure, [place] or hysterectomy as last resort.  Pelvic pain:  pt has had one cycle with OCP, would continue expectant management 3-4 months to see if pelvic pain improves.  Pt will call for new appointment if pain lingers or worsens.      I discussed the assessment and treatment plan with the patient. The patient was provided an opportunity to ask questions and all were answered. The patient agreed with the plan and demonstrated an understanding of the instructions.   The patient was advised to call back or seek an in-person evaluation/go to the ED if the symptoms worsen or if the condition fails to improve as anticipated.  I provided 10 minutes of face-to-face time during this encounter.   Griffin Basil, MD [facility] [place]

## 2021-01-13 NOTE — Progress Notes (Signed)
Pt is follow up u/s. Pt would like to discuss fibroids and pain/bleeding.

## 2021-02-08 ENCOUNTER — Other Ambulatory Visit: Payer: Self-pay | Admitting: Obstetrics

## 2021-02-08 ENCOUNTER — Ambulatory Visit
Admission: RE | Admit: 2021-02-08 | Discharge: 2021-02-08 | Disposition: A | Discharge status: Home / Self Care | Payer: Medicaid Other | Source: Ambulatory Visit | Attending: Obstetrics and Gynecology | Admitting: Obstetrics and Gynecology

## 2021-02-08 DIAGNOSIS — Z1231 Encounter for screening mammogram for malignant neoplasm of breast: Secondary | ICD-10-CM

## 2021-02-08 HISTORY — DX: Breast implant status: Z98.82

## 2021-05-10 ENCOUNTER — Encounter: Payer: Self-pay | Admitting: Obstetrics

## 2021-05-10 ENCOUNTER — Ambulatory Visit: Payer: Medicaid Other | Admitting: Obstetrics

## 2021-05-10 VITALS — BP 125/81 | HR 80 | Ht 66.0 in | Wt 130.2 lb

## 2021-05-10 DIAGNOSIS — N939 Abnormal uterine and vaginal bleeding, unspecified: Secondary | ICD-10-CM | POA: Diagnosis not present

## 2021-05-10 DIAGNOSIS — Z3009 Encounter for other general counseling and advice on contraception: Secondary | ICD-10-CM

## 2021-05-10 NOTE — Progress Notes (Signed)
GYN pt in office for birth control consult. Pt looking for a more permanent form of birth control. Pt has no other concerns at this time. ?

## 2021-05-10 NOTE — Progress Notes (Addendum)
Subjective:  ? ? Catherine Estrada is a 46 y.o. female who presents for contraception counseling. The patient has been on a progestin-only contraceptive pill, and has been having irregular cycles.  She desires permanent sterilizatioln. The patient is sexually active. Pertinent past medical history: none. ? ?The information documented in the HPI was reviewed and verified. ? ?Menstrual History: ?OB History   ? ? Gravida  ?3  ? Para  ?2  ? Term  ?2  ? Preterm  ?0  ? AB  ?1  ? Living  ?2  ?  ? ? SAB  ?1  ? IAB  ?0  ? Ectopic  ?0  ? Multiple  ?0  ? Live Births  ?2  ?   ?  ?  ?  ? ?Patient's last menstrual period was 05/04/2021. ?  ?Patient Active Problem List  ? Diagnosis Date Noted  ? Screening breast examination 10/10/2018  ? Dyspareunia in female 05/15/2017  ? Encounter to discuss test results 12/14/2016  ? Encounter for counseling regarding contraception 11/16/2016  ? Routine screening for STI (sexually transmitted infection) 11/16/2016  ? Fibroids 01/04/2016  ? ?Past Medical History:  ?Diagnosis Date  ? Asthma 2014  ? Fibroid   ? Gastritis   ? History of bilateral breast implants   ? summer 2022  ?  ?Past Surgical History:  ?Procedure Laterality Date  ? VAGINOPLASTY  2012  ? had "vaginal tightening" after last delivery  ?  ? ?Current Outpatient Medications:  ?  albuterol (PROVENTIL HFA;VENTOLIN HFA) 108 (90 Base) MCG/ACT inhaler, Inhale 2 puffs into the lungs every 6 (six) hours as needed for wheezing or shortness of breath., Disp: , Rfl:  ?  Multiple Vitamin (MULTIVITAMIN) tablet, Take 1 tablet by mouth daily., Disp: , Rfl:  ?  Drospirenone (SLYND) 4 MG TABS, Take 1 tablet by mouth daily before breakfast. (Patient not taking: Reported on 05/10/2021), Disp: 28 tablet, Rfl: 11 ?  metroNIDAZOLE (FLAGYL) 500 MG tablet, Take 1 tablet (500 mg total) by mouth 2 (two) times daily. (Patient not taking: Reported on 05/10/2021), Disp: 14 tablet, Rfl: 2 ?No Known Allergies  ?Social History  ? ?Tobacco Use  ? Smoking status: Never   ? Smokeless tobacco: Never  ?Substance Use Topics  ? Alcohol use: No  ?  ?Family History  ?Problem Relation Age of Onset  ? Hypertension Mother   ? Hypertension Father   ? Heart block Sister   ? Heart block Maternal Grandmother   ? Heart block Paternal Grandmother   ? Breast cancer Paternal Aunt 33  ?  ? ? ? ?Review of Systems ?Constitutional: negative for weight loss ?Genitourinary:positive for abnormal menstrual periods, and negative for vaginal discharge ? ? ?Objective:  ? ?BP 125/81   Pulse 80   Ht '5\' 6"'$  (1.676 m)   Wt 130 lb 3.2 oz (59.1 kg)   LMP 05/04/2021   BMI 21.01 kg/m?  ?  ?General:   Alert and no distress  ?Skin:   no rash or abnormalities  ?Lungs:   clear to auscultation bilaterally  ?Heart:   regular rate and rhythm, S1, S2 normal, no murmur, click, rub or gallop  ?The remainder of the physical exam deferred due to the type of encounter  ? ?Lab Review ?Urine pregnancy test ?Labs reviewed yes ?Radiologic studies reviewed yes ? ?I have spent a total of 15 minutes of face-to-face time, excluding clinical staff time, reviewing notes and preparing to see patient, ordering tests and/or medications, and counseling  the patient.  ? ?Assessment:  ? ? 46 y.o., discontinuing oral progesterone-only contraceptive because of AUB and HA's and mood swings.  She desires non-hormonal permanent birth control.  ? ?Plan:  ? ?1. Abnormal uterine bleeding (AUB) with progestin-only pill ?- discontinue Slynd ? ?2. Encounter for other general counseling and advice on contraception ?- options discussed ?- desires tubal sterilization ?  ? All questions answered. ?Contraception: tubal ligation. ?Discussed healthy lifestyle modifications. ?Neurosurgeon distributed. ?Follow up in 1 month.  Tubal Consult and H&P.  Tubal consent form signed today. ? ? ?Shelly Bombard, MD ?05/10/2021 2:48 PM  ?

## 2021-06-15 ENCOUNTER — Encounter: Payer: Self-pay | Admitting: Obstetrics and Gynecology

## 2021-06-15 ENCOUNTER — Other Ambulatory Visit: Payer: Self-pay | Admitting: Obstetrics and Gynecology

## 2021-06-15 ENCOUNTER — Ambulatory Visit: Payer: Medicaid Other | Admitting: Obstetrics and Gynecology

## 2021-06-15 VITALS — BP 131/82 | HR 101 | Ht 66.0 in | Wt 131.4 lb

## 2021-06-15 DIAGNOSIS — N644 Mastodynia: Secondary | ICD-10-CM | POA: Insufficient documentation

## 2021-06-15 DIAGNOSIS — N920 Excessive and frequent menstruation with regular cycle: Secondary | ICD-10-CM | POA: Diagnosis not present

## 2021-06-15 DIAGNOSIS — D219 Benign neoplasm of connective and other soft tissue, unspecified: Secondary | ICD-10-CM | POA: Diagnosis not present

## 2021-06-15 MED ORDER — TRANEXAMIC ACID 650 MG PO TABS: 1300.0000 mg | ORAL_TABLET | Freq: Three times a day (TID) | ORAL | 3 refills | Status: DC

## 2021-06-15 NOTE — Progress Notes (Signed)
GYN pt presents for BTL consult.  Pt c/o of pain in both breast when pressing on them for 3 weeks.

## 2021-06-15 NOTE — Progress Notes (Signed)
Catherine Estrada presents with c/o bilateral breast pain x 3 weeks. Denies trauma, nipple discharge. H/O bilateral implants @ 1 yr ago. FH negative  Also to discuss BTL and uterine fibroids and [facility]. Pt has tried OCP's but did not like the side effects. Has expelled one IUD in the past.  Sexual active without contraception at this time.   PE AF VSS Chaperone present  Lungs clear Heart RRR Breast bilateral implants noted, sym, no nipple discharge, no evidence of masses or implant rupture, no axillary adenopathy  A/P Breast Pain         [facility]MC with uterine fibroids  Will check Dx mammogram Discussed Tx options for [facility] and uterine fibroids. Will try TXA for a few cycles. U/R/B reviewed with pt. Will F/U in 3 months to gage response. If no response, will discussed vag hyst with BS.

## 2021-07-08 ENCOUNTER — Other Ambulatory Visit: Payer: Medicaid Other

## 2021-07-21 ENCOUNTER — Ambulatory Visit: Payer: Medicaid Other

## 2021-07-21 ENCOUNTER — Ambulatory Visit
Admission: RE | Admit: 2021-07-21 | Discharge: 2021-07-21 | Disposition: A | Discharge status: Home / Self Care | Payer: Medicaid Other | Source: Ambulatory Visit | Attending: Obstetrics and Gynecology | Admitting: Obstetrics and Gynecology

## 2021-07-21 ENCOUNTER — Other Ambulatory Visit: Payer: Self-pay | Admitting: Obstetrics and Gynecology

## 2021-07-21 DIAGNOSIS — N644 Mastodynia: Secondary | ICD-10-CM

## 2021-09-07 ENCOUNTER — Telehealth: Payer: Self-pay | Admitting: Obstetrics and Gynecology

## 2021-09-07 NOTE — Telephone Encounter (Signed)
Telephone call to patient to schedule her follow up with Dr. Rip Harbour to discuss AUB and possible hysterectomy.   Patient not in left message for her to call back.

## 2021-10-14 IMAGING — MG MM DIGITAL DIAGNOSTIC UNILAT*R* W/ TOMO W/ CAD
8 series · 9 of 24 positions shown · non-contrast
Comparison: Previous exam(s).

CLINICAL DATA: Patient is recalled after screening study for
evaluation of possible RIGHT breast mass.

EXAM:
DIGITAL DIAGNOSTIC RIGHT MAMMOGRAM WITH CAD AND TOMO
ULTRASOUND RIGHT BREAST

[R ML synth-2D]
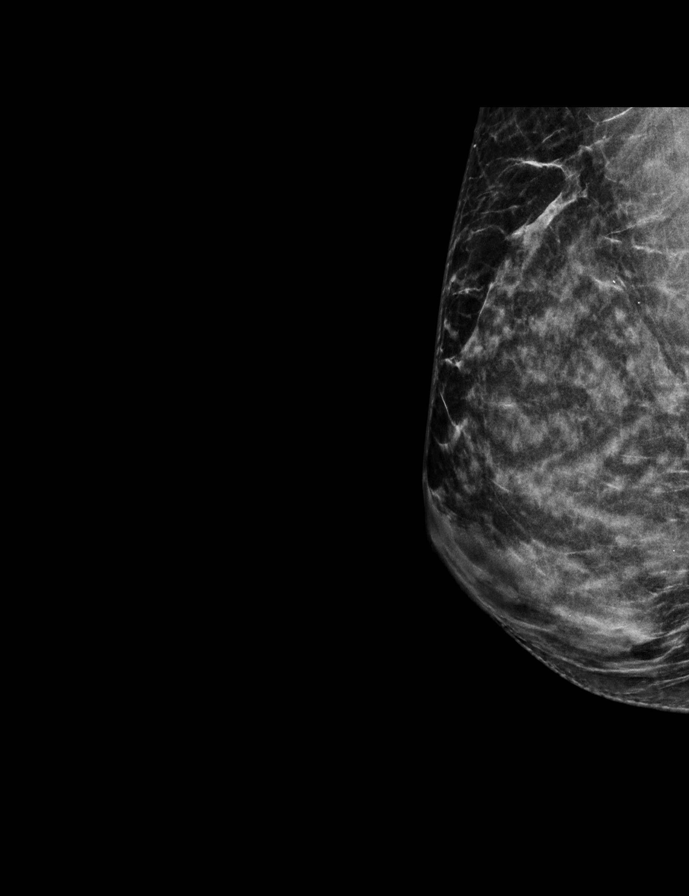

[R CC synth-2D (1 of 3)]
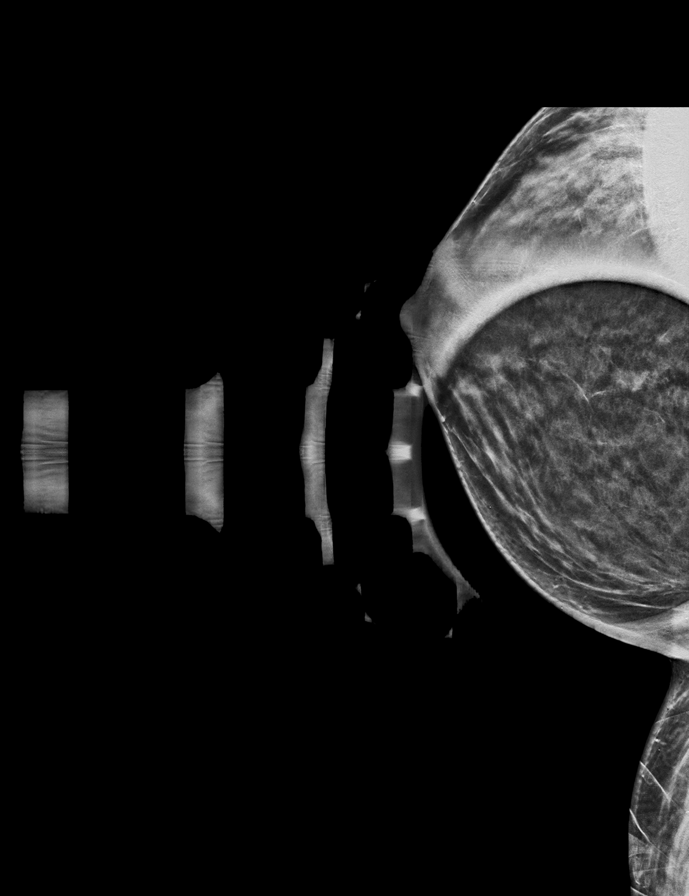

[R CC synth-2D (2 of 3)]
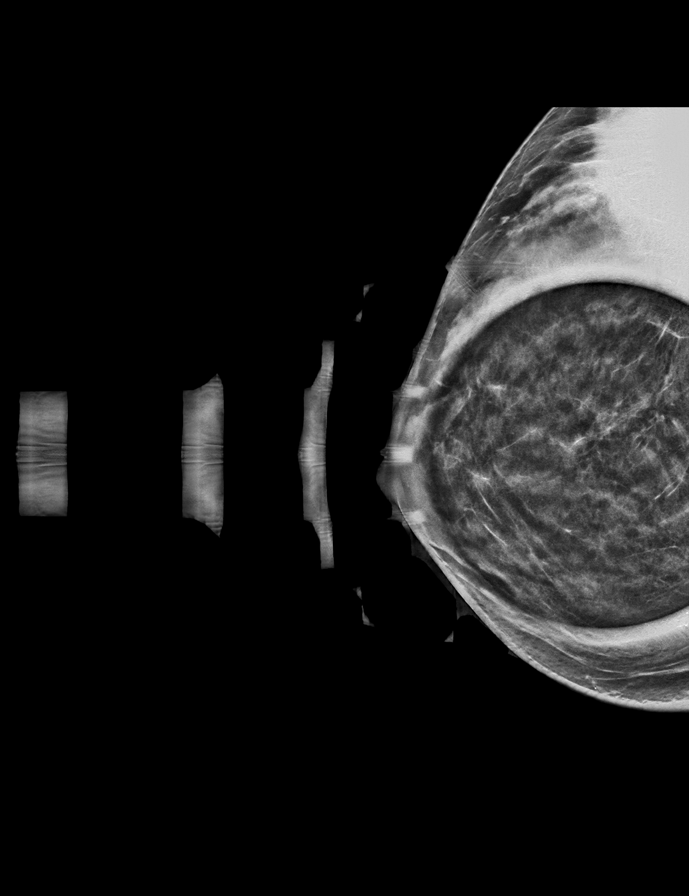

[R CC synth-2D (3 of 3)]
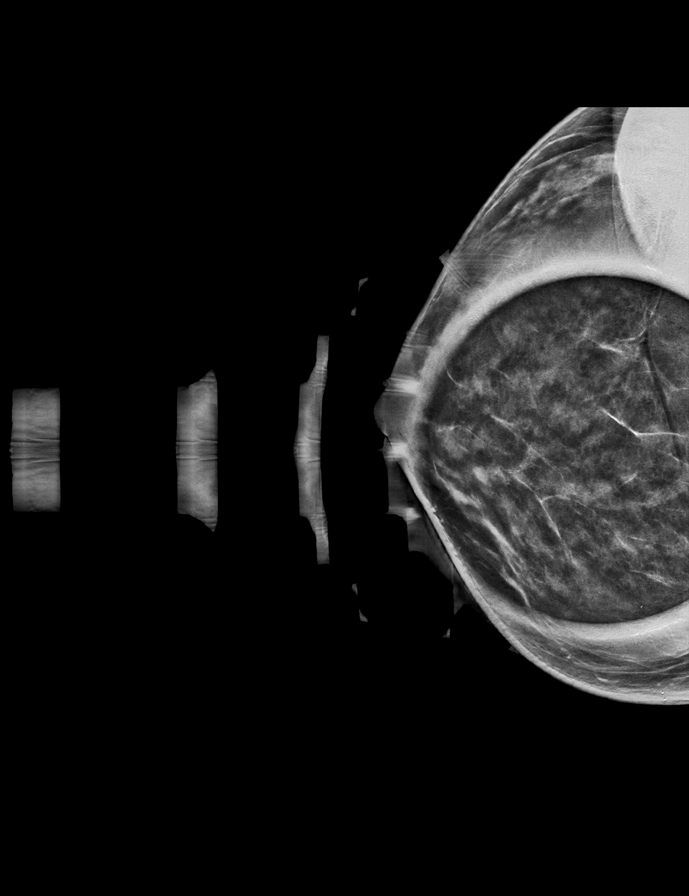

[R CC tomo · 2 of 54 frames shown (1 of 3)]
[frame 18/54]
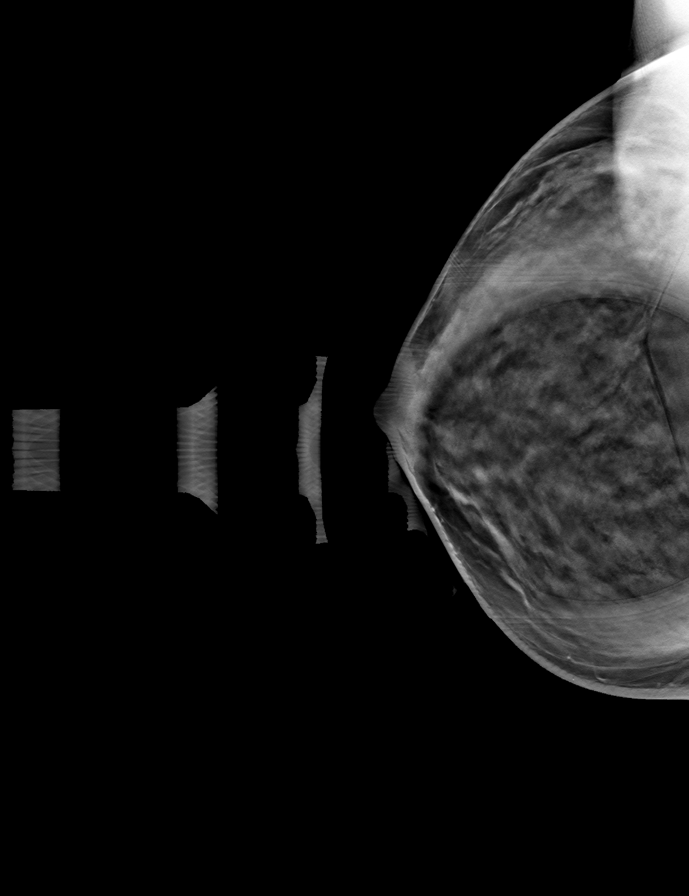
[frame 27/54]
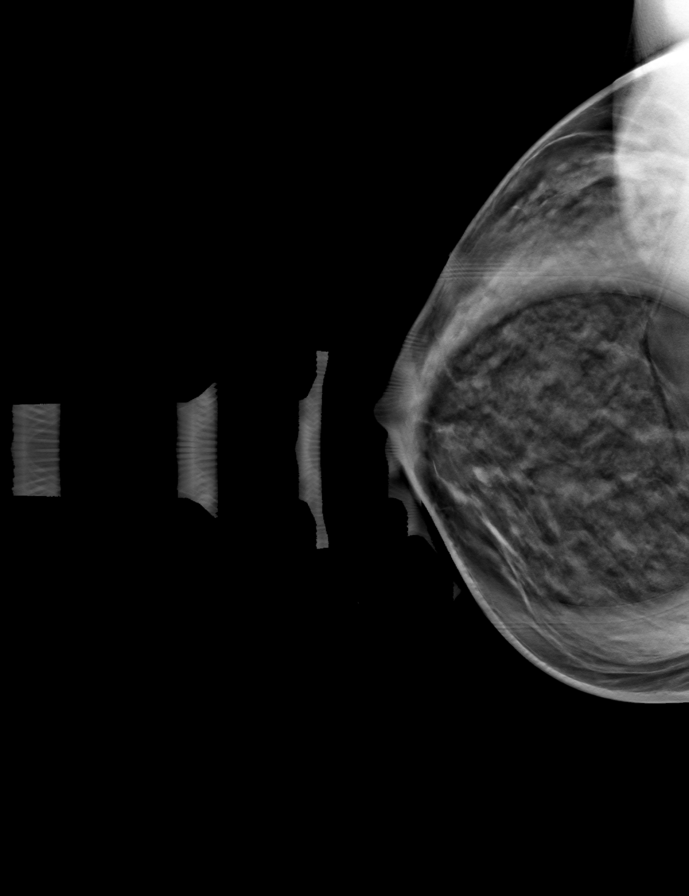

[R ML tomo · tomo slice 28/55.0]
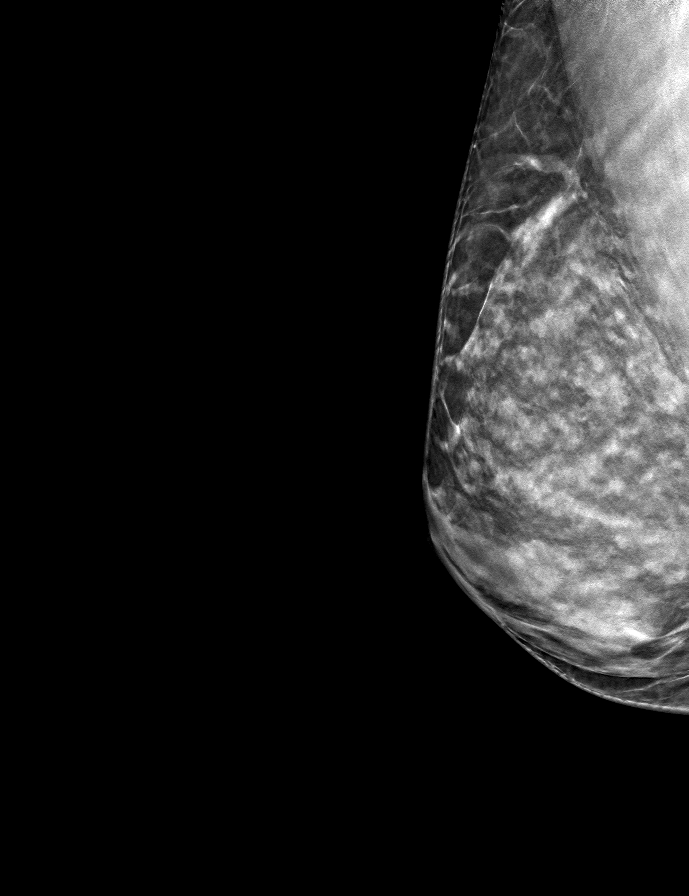

[R CC tomo (2 of 3) · tomo slice 23/46.0]
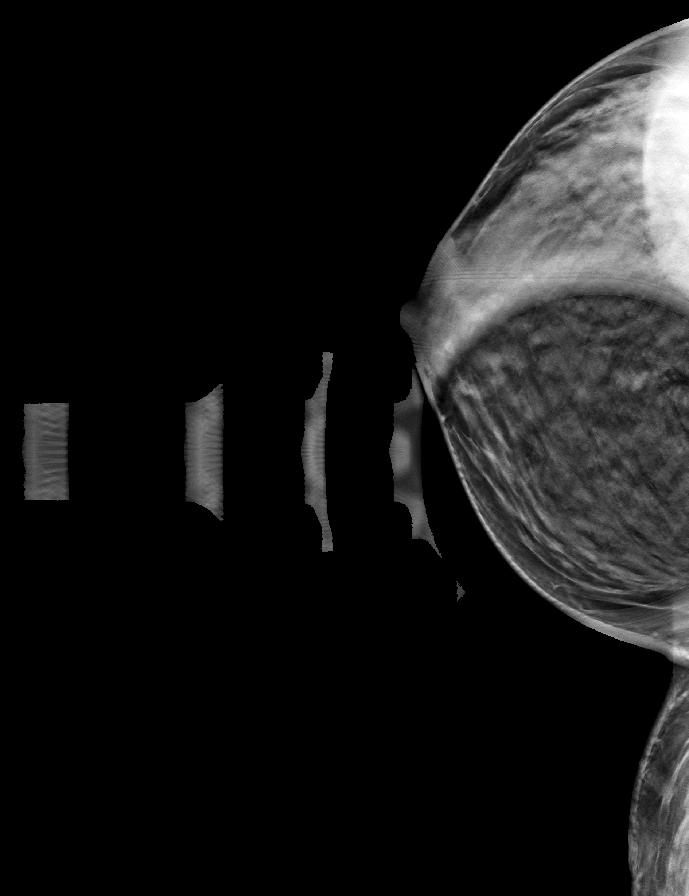

[R CC tomo (3 of 3) · tomo slice 25/50.0]
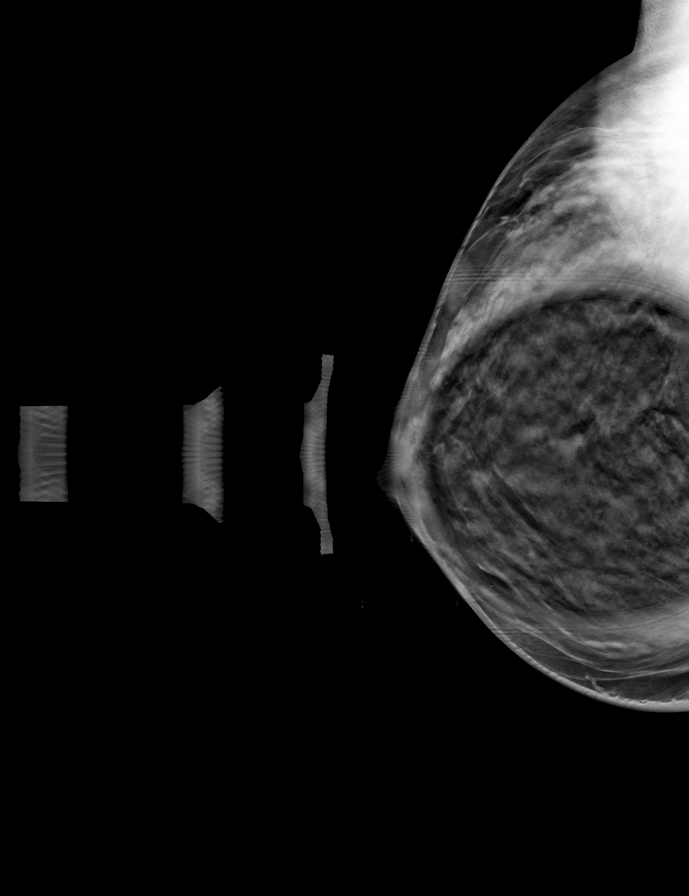

[9 of 24 positions shown; findings below may reference images not displayed]

ACR Breast Density Category d: The breast tissue is extremely dense,
which lowers the sensitivity of mammography.
FINDINGS: Additional 2-D and 3-D images are performed. These views show
possible persistent mass in the posterior central portion of the
RIGHT breast only on the craniocaudal view.

Mammographic images were processed with CAD.

Targeted ultrasound is performed, showing normal appearing dense
fibroglandular tissue in the retroareolar region of the RIGHT
breast. No suspicious mass, distortion, or acoustic shadowing is
demonstrated with ultrasound.

Subsequent spot rolled views are performed, showing no persistent
mass or asymmetry in the posterior aspect of the breast.
IMPRESSION: No mammographic or ultrasound evidence for malignancy.

RECOMMENDATION:
Screening mammogram in one year.(Code:7N-J-54C)

I have discussed the findings and recommendations with the patient.
If applicable, a reminder letter will be sent to the patient
regarding the next appointment.

BI-RADS CATEGORY  1: Negative.

## 2021-10-14 IMAGING — US US BREAST*R* LIMITED INC AXILLA
1 series · 5 of 5 positions shown · non-contrast
Comparison: Previous exam(s).

CLINICAL DATA: Patient is recalled after screening study for
evaluation of possible RIGHT breast mass.

EXAM:
DIGITAL DIAGNOSTIC RIGHT MAMMOGRAM WITH CAD AND TOMO
ULTRASOUND RIGHT BREAST

[Series 1: us breast*right* limited inc axilla · 0.06mm/px · 5 of 5 slices shown]
[im 1/5]
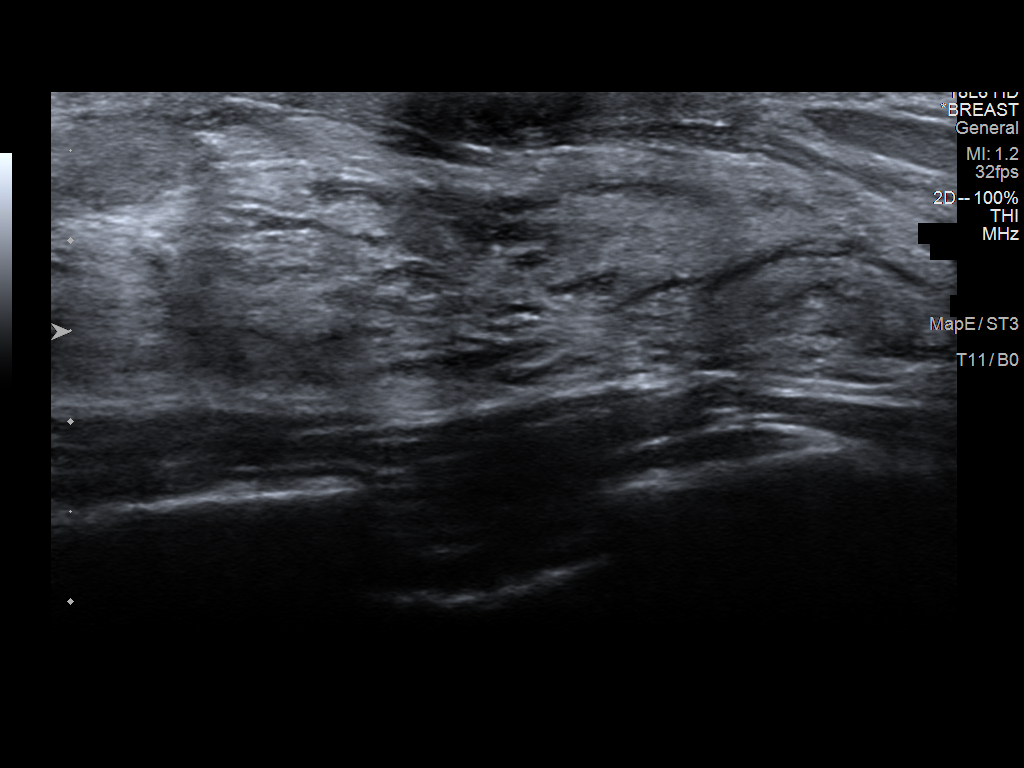
[im 2/5]
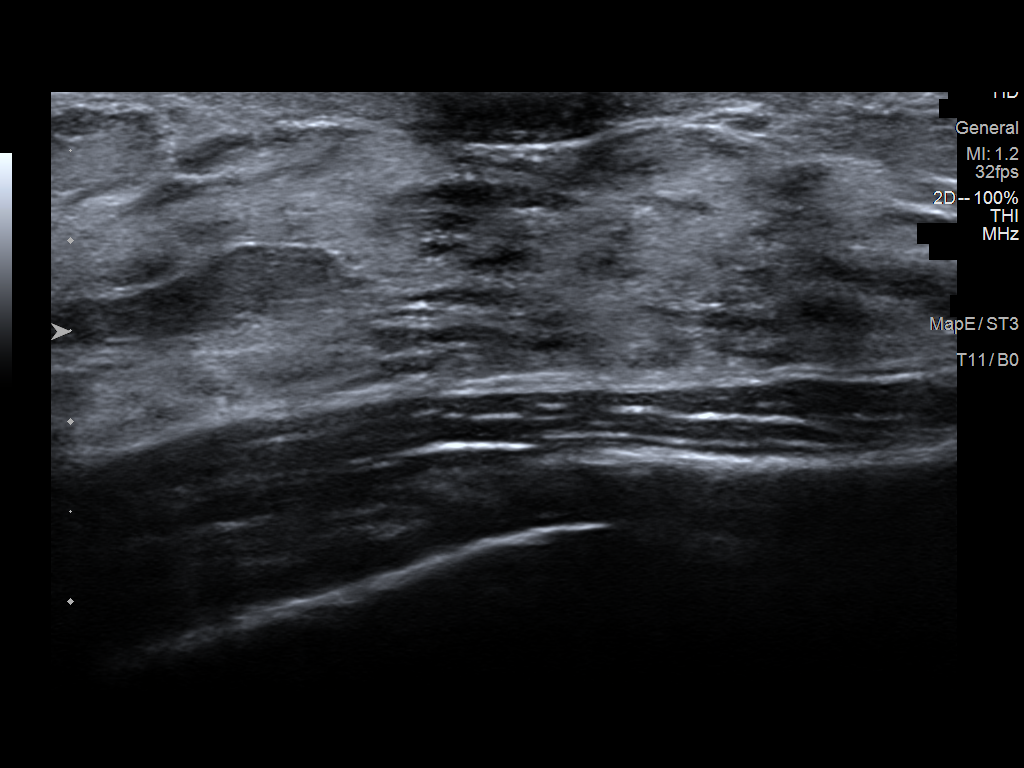
[im 3/5]
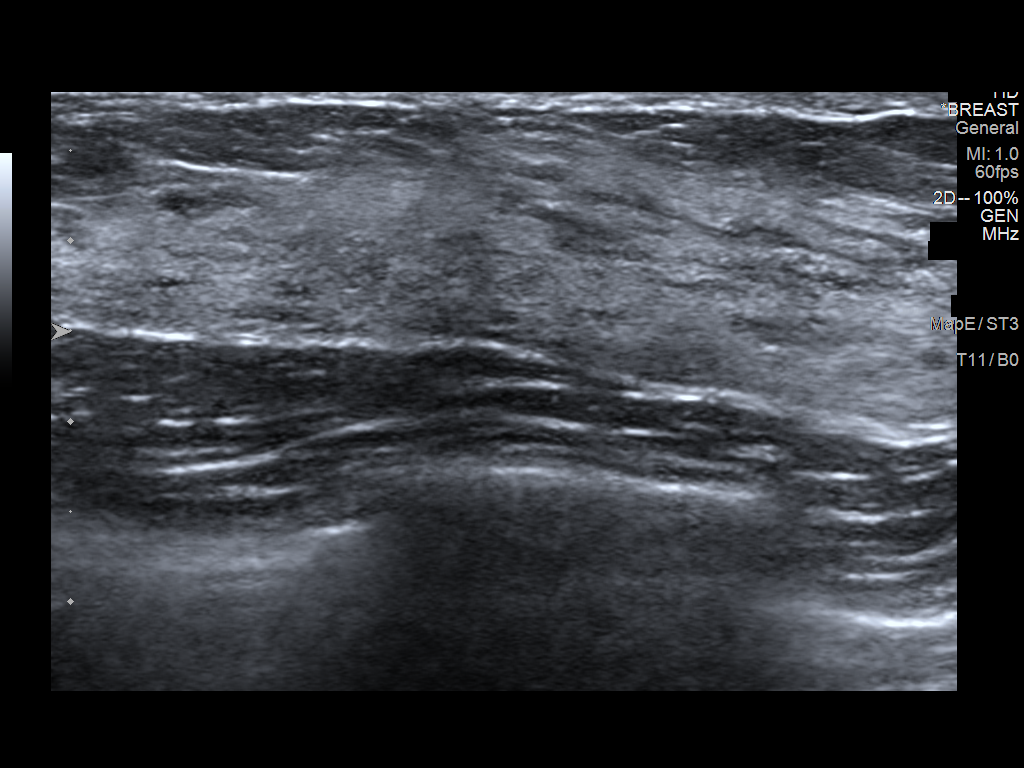
[im 4/5]
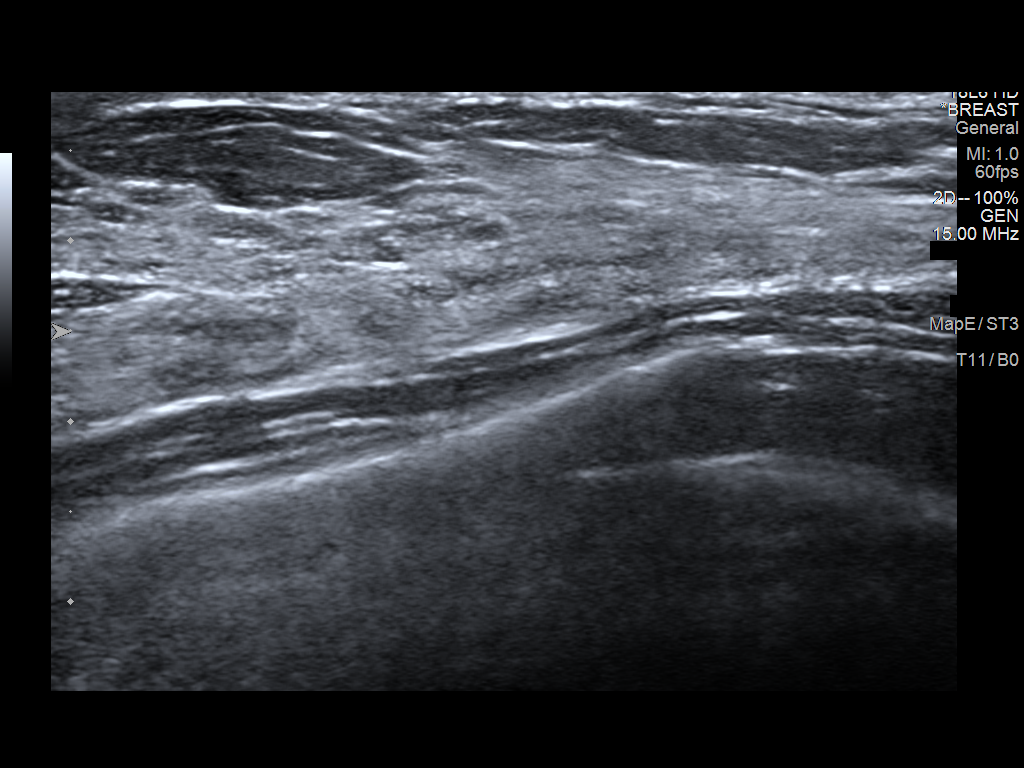
[im 5/5]
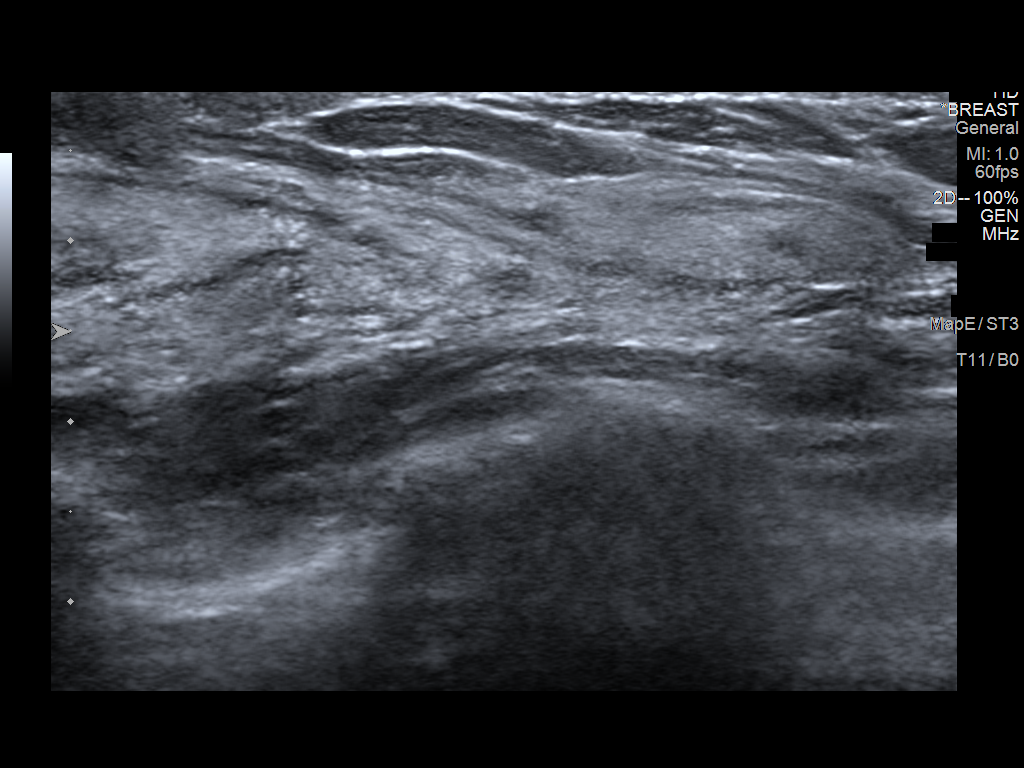

[5 of 5 positions shown; findings below may reference images not displayed]

ACR Breast Density Category d: The breast tissue is extremely dense,
which lowers the sensitivity of mammography.
FINDINGS: Additional 2-D and 3-D images are performed. These views show
possible persistent mass in the posterior central portion of the
RIGHT breast only on the craniocaudal view.

Mammographic images were processed with CAD.

Targeted ultrasound is performed, showing normal appearing dense
fibroglandular tissue in the retroareolar region of the RIGHT
breast. No suspicious mass, distortion, or acoustic shadowing is
demonstrated with ultrasound.

Subsequent spot rolled views are performed, showing no persistent
mass or asymmetry in the posterior aspect of the breast.
IMPRESSION: No mammographic or ultrasound evidence for malignancy.

RECOMMENDATION:
Screening mammogram in one year.(Code:7N-J-54C)

I have discussed the findings and recommendations with the patient.
If applicable, a reminder letter will be sent to the patient
regarding the next appointment.

BI-RADS CATEGORY  1: Negative.

## 2021-10-19 ENCOUNTER — Encounter: Payer: Self-pay | Admitting: Obstetrics and Gynecology

## 2021-10-19 ENCOUNTER — Ambulatory Visit: Payer: Medicaid Other | Admitting: Obstetrics and Gynecology

## 2021-10-19 ENCOUNTER — Other Ambulatory Visit (HOSPITAL_COMMUNITY)
Admission: RE | Admit: 2021-10-19 | Discharge: 2021-10-19 | Disposition: A | Discharge status: Home / Self Care | Payer: Medicaid Other | Source: Ambulatory Visit | Attending: Obstetrics and Gynecology | Admitting: Obstetrics and Gynecology

## 2021-10-19 VITALS — BP 118/84 | HR 80 | Ht 64.5 in | Wt 129.0 lb

## 2021-10-19 DIAGNOSIS — N898 Other specified noninflammatory disorders of vagina: Secondary | ICD-10-CM

## 2021-10-19 DIAGNOSIS — N3020 Other chronic cystitis without hematuria: Secondary | ICD-10-CM

## 2021-10-19 DIAGNOSIS — D219 Benign neoplasm of connective and other soft tissue, unspecified: Secondary | ICD-10-CM | POA: Diagnosis not present

## 2021-10-19 DIAGNOSIS — N920 Excessive and frequent menstruation with regular cycle: Secondary | ICD-10-CM | POA: Diagnosis not present

## 2021-10-19 DIAGNOSIS — N949 Unspecified condition associated with female genital organs and menstrual cycle: Secondary | ICD-10-CM

## 2021-10-19 MED ORDER — TRANEXAMIC ACID 650 MG PO TABS: 1300.0000 mg | ORAL_TABLET | Freq: Three times a day (TID) | ORAL | 3 refills | Status: DC

## 2021-10-19 MED ORDER — LEVONORGESTREL-ETHINYL ESTRAD 0.1-20 MG-MCG PO TABS: 1.0000 | ORAL_TABLET | Freq: Every day | ORAL | 11 refills | Status: DC

## 2021-10-19 NOTE — Progress Notes (Signed)
46 y.o  GYN presents for AUB follow up.

## 2021-10-19 NOTE — Progress Notes (Signed)
Janeece Fitting presents for F/U of uterine fibroids and HMC. See prior office note Pt reports cycles must better with TXA but only takes one tablet instead of 2. Would like to restart OCP's as well.  PE AF VSS Lungs clear Heart RRR Abd soft + BS  A/P [facility]        Uterine fibroid        Vaginal discomfort  Will continue with TXA. Restart OCP's.  Rxs provided. Self swab today F/U PRN

## 2021-10-20 ENCOUNTER — Telehealth: Payer: Self-pay

## 2021-10-20 LAB — CERVICOVAGINAL ANCILLARY ONLY
Bacterial Vaginitis (gardnerella): POSITIVE — AB
Candida Glabrata: NEGATIVE
Candida Vaginitis: NEGATIVE
Chlamydia: NEGATIVE
Comment: NEGATIVE
Comment: NEGATIVE
Comment: NEGATIVE
Comment: NEGATIVE
Comment: NEGATIVE
Comment: NORMAL
Neisseria Gonorrhea: NEGATIVE
Trichomonas: NEGATIVE

## 2021-10-20 MED ORDER — METRONIDAZOLE 500 MG PO TABS: 500.0000 mg | ORAL_TABLET | Freq: Two times a day (BID) | ORAL | 0 refills | Status: DC

## 2021-10-20 NOTE — Telephone Encounter (Signed)
Advised pt of results and rx sent per protocol

## 2021-11-08 ENCOUNTER — Other Ambulatory Visit: Payer: Self-pay | Admitting: Emergency Medicine

## 2021-11-08 DIAGNOSIS — N898 Other specified noninflammatory disorders of vagina: Secondary | ICD-10-CM

## 2021-11-08 MED ORDER — FLUCONAZOLE 200 MG PO TABS: 200.0000 mg | ORAL_TABLET | ORAL | 1 refills | Status: DC

## 2021-11-08 NOTE — Progress Notes (Signed)
Rx sent for vaginal yeast infection.  Per Haper, MD

## 2021-11-29 ENCOUNTER — Encounter: Payer: Medicaid Other | Admitting: Surgical

## 2021-12-13 ENCOUNTER — Encounter: Payer: Self-pay | Admitting: Surgical

## 2021-12-13 ENCOUNTER — Encounter: Payer: Medicaid Other | Admitting: Surgical

## 2021-12-13 ENCOUNTER — Ambulatory Visit (INDEPENDENT_AMBULATORY_CARE_PROVIDER_SITE_OTHER): Payer: Self-pay | Admitting: Plastic Surgery

## 2021-12-13 VITALS — BP 129/80 | HR 91

## 2021-12-13 DIAGNOSIS — Z719 Counseling, unspecified: Secondary | ICD-10-CM

## 2021-12-13 NOTE — Progress Notes (Signed)
Filler Injection Procedure Note  Procedure:  Filler administration  Pre-operative Diagnosis: Rytides   Post-operative Diagnosis: Same  Surgeon: Electronically signed by: Theodoro Kos, DO   Complications:  None  Brief history: The patient desires injection with fillers in her face. I discussed with the patient this proposed procedure of filler injections, which is customized depending on the particular needs of the patient. It is performed on facial volume loss as a temporary correction. The alternatives were discussed with the patient. The risks were addressed including bleeding, scarring, infection, damage to deeper structures, asymmetry, and chronic pain, which may occur infrequently after a procedure. The individual's choice to undergo a surgical procedure is based on the comparison of risks to potential benefits. Other risks include unsatisfactory results, allergic reaction, which should go away with time, bruising and delayed healing. Fillers do not arrest the aging process or produce permanent tightening.  Operative intervention maybe necessary to maintain the results. The patient understands and wishes to proceed. An informed consent was signed and informational brochures given to her prior to the procedure.  Procedure: The area was prepped with chlorhexidine and dried with a clean gauze. Using a clean technique, a 30 gauge needle was then used to inject the filler into the nasal labial folds and perioral wrinkles. This was done with one syringe.  No complications were noted. Light pressure was held for 5 minutes. She was instructed explicitly in post-operative care.  Juvederm Ultra 0.55 ml LOT: [id number] EXP: 11/23

## 2022-02-20 ENCOUNTER — Other Ambulatory Visit: Payer: Self-pay

## 2022-02-20 DIAGNOSIS — Z7951 Long term (current) use of inhaled steroids: Secondary | ICD-10-CM | POA: Diagnosis not present

## 2022-02-20 DIAGNOSIS — G43409 Hemiplegic migraine, not intractable, without status migrainosus: Secondary | ICD-10-CM | POA: Diagnosis not present

## 2022-02-20 DIAGNOSIS — R519 Headache, unspecified: Secondary | ICD-10-CM | POA: Diagnosis present

## 2022-02-20 DIAGNOSIS — J45909 Unspecified asthma, uncomplicated: Secondary | ICD-10-CM | POA: Diagnosis not present

## 2022-02-20 LAB — COMPREHENSIVE METABOLIC PANEL
ALT: 10 U/L (ref 0–44)
AST: 15 U/L (ref 15–41)
Albumin: 4.4 g/dL (ref 3.5–5.0)
Alkaline Phosphatase: 55 U/L (ref 38–126)
Anion gap: 12 (ref 5–15)
BUN: 10 mg/dL (ref 6–20)
CO2: 23 mmol/L (ref 22–32)
Calcium: 9.6 mg/dL (ref 8.9–10.3)
Chloride: 101 mmol/L (ref 98–111)
Creatinine, Ser: 0.74 mg/dL (ref 0.44–1.00)
GFR, Estimated: >60 mL/min (ref >60)
Glucose, Bld: 142 mg/dL — ABNORMAL HIGH (ref 70–99)
Potassium: 3.8 mmol/L (ref 3.5–5.1)
Sodium: 136 mmol/L (ref 135–145)
Total Bilirubin: 0.4 mg/dL (ref 0.3–1.2)
Total Protein: 7.9 g/dL (ref 6.5–8.1)

## 2022-02-20 LAB — CBC
HCT: 41.5 % (ref 36.0–46.0)
Hemoglobin: 13.8 g/dL (ref 12.0–15.0)
MCH: 28.1 pg (ref 26.0–34.0)
MCHC: 33.3 g/dL (ref 30.0–36.0)
MCV: 84.5 fL (ref 80.0–100.0)
Platelets: 261 10*3/uL (ref 150–400)
RBC: 4.91 MIL/uL (ref 3.87–5.11)
RDW: 13.1 % (ref 11.5–15.5)
WBC: 12.3 10*3/uL — ABNORMAL HIGH (ref 4.0–10.5)
nRBC: 0 % (ref 0.0–0.2)

## 2022-02-20 LAB — LIPASE, BLOOD: Lipase: 34 U/L (ref 11–51)

## 2022-02-20 LAB — HCG, SERUM, QUALITATIVE: Preg, Serum: NEGATIVE

## 2022-02-20 MED ORDER — ONDANSETRON HCL 4 MG/2ML IJ SOLN
4.0000 mg | Freq: Once | INTRAMUSCULAR | Status: AC | PRN
  Administered 2022-02-20: 4 mg via INTRAVENOUS
  Filled 2022-02-20: qty 2

## 2022-02-20 MED ORDER — ONDANSETRON HCL 4 MG/2ML IJ SOLN
INTRAMUSCULAR | Status: AC
  Administered 2022-02-20: 4 mg via INTRAVENOUS
  Filled 2022-02-20: qty 2

## 2022-02-20 MED ORDER — IBUPROFEN 400 MG PO TABS
400.0000 mg | ORAL_TABLET | Freq: Once | ORAL | Status: AC | PRN
  Administered 2022-02-20: 400 mg via ORAL
  Filled 2022-02-20: qty 1

## 2022-02-20 MED ORDER — ONDANSETRON HCL 4 MG/2ML IJ SOLN
INTRAMUSCULAR | Status: AC
  Filled 2022-02-20: qty 2

## 2022-02-20 MED ORDER — ONDANSETRON HCL 4 MG/2ML IJ SOLN: 4.0000 mg | Freq: Once | INTRAMUSCULAR | Status: AC

## 2022-02-20 NOTE — ED Notes (Signed)
Pt states she will take ibuprofen.

## 2022-02-20 NOTE — ED Triage Notes (Signed)
Pt complains of migraine for 3 days- HA, N, sensitivity to light. Today reports new emesis- 3X today. Has been taking OTC migraine meds and Fioricet- no relief.  HX gastritis.  Denies CP, denies SOB.

## 2022-02-20 NOTE — ED Notes (Addendum)
RN called to lobby by patient. Pt states her head is hurting and is upset she has not been seen yet. Pt pulled to triage rm for reassessment. Pt A&Ox4, c/o headache and nausea. VSS.  Offered pt ibuprofen for pain, pt declined.

## 2022-02-21 ENCOUNTER — Emergency Department (HOSPITAL_BASED_OUTPATIENT_CLINIC_OR_DEPARTMENT_OTHER)
Admission: EM | Admit: 2022-02-21 | Discharge: 2022-02-21 | Disposition: A | Discharge status: Home / Self Care | Payer: Medicaid Other | Attending: Emergency Medicine | Admitting: Emergency Medicine

## 2022-02-21 ENCOUNTER — Encounter (HOSPITAL_BASED_OUTPATIENT_CLINIC_OR_DEPARTMENT_OTHER): Payer: Self-pay | Admitting: Emergency Medicine

## 2022-02-21 DIAGNOSIS — G43409 Hemiplegic migraine, not intractable, without status migrainosus: Secondary | ICD-10-CM

## 2022-02-21 HISTORY — DX: Migraine, unspecified, not intractable, without status migrainosus: G43.909

## 2022-02-21 MED ORDER — HALOPERIDOL LACTATE 5 MG/ML IJ SOLN
2.5000 mg | Freq: Once | INTRAMUSCULAR | Status: AC
  Administered 2022-02-21: 2.5 mg via INTRAVENOUS
  Filled 2022-02-21: qty 1

## 2022-02-21 MED ORDER — DEXAMETHASONE SODIUM PHOSPHATE 10 MG/ML IJ SOLN
10.0000 mg | Freq: Once | INTRAMUSCULAR | Status: AC
  Administered 2022-02-21: 10 mg via INTRAMUSCULAR
  Filled 2022-02-21: qty 1

## 2022-02-21 MED ORDER — DEXAMETHASONE SODIUM PHOSPHATE 10 MG/ML IJ SOLN: 10.0000 mg | Freq: Once | INTRAMUSCULAR | Status: DC

## 2022-02-21 MED ORDER — DIPHENHYDRAMINE HCL 50 MG/ML IJ SOLN
25.0000 mg | Freq: Once | INTRAMUSCULAR | Status: AC
  Administered 2022-02-21: 25 mg via INTRAVENOUS
  Filled 2022-02-21: qty 1

## 2022-02-21 MED ORDER — SODIUM CHLORIDE 0.9 % IV BOLUS
1000.0000 mL | Freq: Once | INTRAVENOUS | Status: AC
  Administered 2022-02-21: 1000 mL via INTRAVENOUS

## 2022-02-21 MED ORDER — KETOROLAC TROMETHAMINE 15 MG/ML IJ SOLN
15.0000 mg | Freq: Once | INTRAMUSCULAR | Status: AC
  Administered 2022-02-21: 15 mg via INTRAVENOUS
  Filled 2022-02-21: qty 1

## 2022-02-21 MED ORDER — METOCLOPRAMIDE HCL 5 MG/ML IJ SOLN
10.0000 mg | Freq: Once | INTRAMUSCULAR | Status: AC
  Administered 2022-02-21: 10 mg via INTRAVENOUS
  Filled 2022-02-21: qty 2

## 2022-02-21 NOTE — ED Provider Notes (Addendum)
[facility] Provider Note: Georgena Spurling, MD, FACEP  CSN: [id number] MRN: [id number] ARRIVAL: 02/20/22 at [place]: [id number]   CHIEF COMPLAINT  Migraine   HISTORY OF PRESENT ILLNESS  02/21/22 12:11 AM Catherine Estrada is a 47 y.o. female with a history of migraines.  She is here with a headache for the past 3 days consistent with previous migraines but more severe.  The headache is primarily frontal and is severe.  She has been taking Excedrin Migraine and Fioricet without relief.  After arriving in the emergency department yesterday evening she started vomiting and was given a total of 2 doses of Zofran with relief of her vomiting but not her headache.  She was also given ibuprofen.  She has had associated photophobia and phonophobia.    Past Medical History:  Diagnosis Date   Asthma 2014   Fibroid    Gastritis    History of bilateral breast implants    summer 2022   Migraine     Past Surgical History:  Procedure Laterality Date   VAGINOPLASTY  2012   had "vaginal tightening" after last delivery    Family History  Problem Relation Age of Onset   Hypertension Mother    Hypertension Father    Heart block Sister    Breast cancer Maternal Aunt    Breast cancer Paternal Aunt 8   Heart block Maternal Grandmother    Heart block Paternal Grandmother     Social History   Tobacco Use   Smoking status: Never   Smokeless tobacco: Never  Vaping Use   Vaping Use: Never used  Substance Use Topics   Alcohol use: No   Drug use: No    Prior to Admission medications   Medication Sig Start Date End Date Taking? Authorizing Provider  albuterol (PROVENTIL HFA;VENTOLIN HFA) 108 (90 Base) MCG/ACT inhaler Inhale 2 puffs into the lungs every 6 (six) hours as needed for wheezing or shortness of breath.    [provider]  levonorgestrel-ethinyl estradiol (AVIANE) 0.1-20 MG-MCG tablet Take 1 tablet by mouth daily. 10/19/21   Chancy Milroy, MD  Multiple Vitamin  (MULTIVITAMIN) tablet Take 1 tablet by mouth daily.    [provider]  tranexamic acid (LYSTEDA) 650 MG TABS tablet Take 2 tablets (1,300 mg total) by mouth 3 (three) times daily. Take during menses for a maximum of five days 10/19/21   Chancy Milroy, MD    Allergies Patient has no known allergies.   REVIEW OF SYSTEMS  Negative except as noted here or in the History of Present Illness.   PHYSICAL EXAMINATION  Initial Vital Signs Blood pressure (!) 158/91, pulse 98, temperature 98.1 F (36.7 C), temperature source Oral, resp. rate 18, height '5\' 4"'$  (1.626 m), weight 56.7 kg, SpO2 100 %.  Examination General: Well-developed, well-nourished female in no acute distress; appearance consistent with age of record HENT: normocephalic; atraumatic Eyes: Normal appearance; photophobia Neck: supple Heart: regular rate and rhythm Lungs: clear to auscultation bilaterally Abdomen: soft; nondistended; mild epigastric tenderness; bowel sounds present Extremities: No deformity; full range of motion; pulses normal Neurologic: Awake, alert and oriented; motor function intact in all extremities and symmetric; no facial droop Skin: Warm and dry Psychiatric: Flat affect   RESULTS  Summary of this visit's results, reviewed and interpreted by myself:   EKG Interpretation  Date/Time:  Tuesday February 21 2022 02:30:17 EST Ventricular Rate:  89 PR Interval:  142 QRS Duration: 82 QT Interval:  377 QTC Calculation:  459 R Axis:   83 Text Interpretation: Sinus rhythm Borderline T abnormalities, anterior leads No previous ECGs available Confirmed by Kaleiyah Polsky, Jenny Reichmann [phone]) on 02/21/2022 2:44:48 AM       Laboratory Studies: Results for orders placed or performed during the hospital encounter of 02/21/22 (from the past 24 hour(s))  Lipase, blood     Status: None   Collection Time: 02/20/22  7:59 PM  Result Value Ref Range   Lipase 34 11 - 51 U/L  Comprehensive metabolic panel     Status:  Abnormal   Collection Time: 02/20/22  7:59 PM  Result Value Ref Range   Sodium 136 135 - 145 mmol/L   Potassium 3.8 3.5 - 5.1 mmol/L   Chloride 101 98 - 111 mmol/L   CO2 23 22 - 32 mmol/L   Glucose, Bld 142 (H) 70 - 99 mg/dL   BUN 10 6 - 20 mg/dL   Creatinine, Ser 0.74 0.44 - 1.00 mg/dL   Calcium 9.6 8.9 - 10.3 mg/dL   Total Protein 7.9 6.5 - 8.1 g/dL   Albumin 4.4 3.5 - 5.0 g/dL   AST 15 15 - 41 U/L   ALT 10 0 - 44 U/L   Alkaline Phosphatase 55 38 - 126 U/L   Total Bilirubin 0.4 0.3 - 1.2 mg/dL   GFR, Estimated >60 >60 mL/min   Anion gap 12 5 - 15  CBC     Status: Abnormal   Collection Time: 02/20/22  7:59 PM  Result Value Ref Range   WBC 12.3 (H) 4.0 - 10.5 K/uL   RBC 4.91 3.87 - 5.11 MIL/uL   Hemoglobin 13.8 12.0 - 15.0 g/dL   HCT 41.5 36.0 - 46.0 %   MCV 84.5 80.0 - 100.0 fL   MCH 28.1 26.0 - 34.0 pg   MCHC 33.3 30.0 - 36.0 g/dL   RDW 13.1 11.5 - 15.5 %   Platelets 261 150 - 400 K/uL   nRBC 0.0 0.0 - 0.2 %  hCG, serum, qualitative     Status: None   Collection Time: 02/20/22  7:59 PM  Result Value Ref Range   Preg, Serum NEGATIVE NEGATIVE   Imaging Studies: No results found.  ED COURSE and MDM  Nursing notes, initial and subsequent vitals signs, including pulse oximetry, reviewed and interpreted by myself.  Vitals:   02/20/22 1935 02/20/22 2318 02/21/22 0012 02/21/22 0153  BP: (!) 160/95 (!) 158/91  138/85  Pulse: (!) 114 98  87  Resp: '18 18  18  '$ Temp: 98.1 F (36.7 C)  97.9 F (36.6 C)   TempSrc: Oral  Oral   SpO2: 100% 100%  100%  Weight:      Height:       Medications  ondansetron (ZOFRAN) injection 4 mg ( Intravenous Not Given 02/21/22 0022)  ondansetron (ZOFRAN) injection 4 mg (4 mg Intravenous Given 02/20/22 2329)  ibuprofen (ADVIL) tablet 400 mg (400 mg Oral Given 02/20/22 2333)  sodium chloride 0.9 % bolus 1,000 mL (0 mLs Intravenous Stopped 02/21/22 0154)  diphenhydrAMINE (BENADRYL) injection 25 mg (25 mg Intravenous Given 02/21/22 0031)   metoCLOPramide (REGLAN) injection 10 mg (10 mg Intravenous Given 02/21/22 0031)  ketorolac (TORADOL) 15 MG/ML injection 15 mg (15 mg Intravenous Given 02/21/22 0031)  dexamethasone (DECADRON) injection 10 mg (10 mg Intramuscular Given 02/21/22 0214)   2:01 AM Patient's headache significantly improved after IV fluids and medications.  She states she is ready to go home.  2:25 AM On attempting to  ambulate out of the emergency department the patient's headache returned.  She states she cannot go home like this.  Patient undischarged and returned to room.   3:47 AM Headache relieved with IV haloperidol.  Patient now ambulating without return of headache.  PROCEDURES  Procedures   ED DIAGNOSES     ICD-10-CM   1. Sporadic migraine  G43.[place]          Chenita Ruda, MD 02/21/22 0201    Shanon Rosser, MD 02/21/22 [phone]

## 2022-02-21 NOTE — ED Notes (Signed)
Pt able to ambulate without headache returning. Pt states she thinks she is able to go home. Dr. Florina Ou agrees, pt to be discharged.

## 2022-04-28 ENCOUNTER — Other Ambulatory Visit (HOSPITAL_COMMUNITY)
Admission: RE | Admit: 2022-04-28 | Discharge: 2022-04-28 | Disposition: A | Discharge status: Home / Self Care | Payer: Medicaid Other | Source: Ambulatory Visit | Attending: Obstetrics | Admitting: Obstetrics

## 2022-04-28 ENCOUNTER — Ambulatory Visit: Payer: Medicaid Other | Admitting: Obstetrics

## 2022-04-28 ENCOUNTER — Encounter: Payer: Self-pay | Admitting: Obstetrics

## 2022-04-28 VITALS — BP 143/93 | HR 83 | Ht 65.0 in | Wt 129.0 lb

## 2022-04-28 DIAGNOSIS — N3941 Urge incontinence: Secondary | ICD-10-CM

## 2022-04-28 DIAGNOSIS — Z3009 Encounter for other general counseling and advice on contraception: Secondary | ICD-10-CM

## 2022-04-28 DIAGNOSIS — Z3041 Encounter for surveillance of contraceptive pills: Secondary | ICD-10-CM

## 2022-04-28 DIAGNOSIS — R102 Pelvic and perineal pain: Secondary | ICD-10-CM

## 2022-04-28 DIAGNOSIS — Z113 Encounter for screening for infections with a predominantly sexual mode of transmission: Secondary | ICD-10-CM | POA: Diagnosis not present

## 2022-04-28 DIAGNOSIS — G43009 Migraine without aura, not intractable, without status migrainosus: Secondary | ICD-10-CM

## 2022-04-28 DIAGNOSIS — N898 Other specified noninflammatory disorders of vagina: Secondary | ICD-10-CM

## 2022-04-28 MED ORDER — SLYND 4 MG PO TABS: 1.0000 | ORAL_TABLET | Freq: Every day | ORAL | 11 refills | Status: DC

## 2022-04-28 NOTE — Progress Notes (Signed)
Pt is in the office complaining of pelvic pain.  LMP 04/10/22 Last pap 12/15/2020 Hx of fibroids Pt reports that she starting OCP in October after last visit 10/19/21, but she has a hx of migraines and feels that they were getting worse.  Pt reports that she has increased urgency to urinate and has had issues with making it to the restroom in time, symptoms began about a week ago.

## 2022-04-28 NOTE — Progress Notes (Signed)
Patient ID: Catherine Estrada, female   DOB: Jun 18, 1975, 47 y.o.   MRN: [id number]  Chief Complaint  Patient presents with   GYN    HPI Catherine Estrada is a 47 y.o. female.  Complains of heavy periods and vaginal discharge.  Also c/o urinary urgency, and leaking of urine before getting to bathroom sometimes. HPI  Past Medical History:  Diagnosis Date   Asthma 2014   Fibroid    Gastritis    History of bilateral breast implants    summer 2022   Migraine     Past Surgical History:  Procedure Laterality Date   VAGINOPLASTY  2012   had "vaginal tightening" after last delivery    Family History  Problem Relation Age of Onset   Hypertension Mother    Hypertension Father    Heart block Sister    Breast cancer Maternal Aunt    Breast cancer Paternal Aunt 72   Heart block Maternal Grandmother    Heart block Paternal Grandmother     Social History Social History   Tobacco Use   Smoking status: Never   Smokeless tobacco: Never  Vaping Use   Vaping Use: Never used  Substance Use Topics   Alcohol use: No   Drug use: No    No Known Allergies  Current Outpatient Medications  Medication Sig Dispense Refill   albuterol (PROVENTIL HFA;VENTOLIN HFA) 108 (90 Base) MCG/ACT inhaler Inhale 2 puffs into the lungs every 6 (six) hours as needed for wheezing or shortness of breath.     Drospirenone (SLYND) 4 MG TABS Take 1 tablet (4 mg total) by mouth daily before breakfast. 28 tablet 11   Multiple Vitamin (MULTIVITAMIN) tablet Take 1 tablet by mouth daily. (Patient not taking: Reported on 04/28/2022)     tranexamic acid (LYSTEDA) 650 MG TABS tablet Take 2 tablets (1,300 mg total) by mouth 3 (three) times daily. Take during menses for a maximum of five days (Patient not taking: Reported on 04/28/2022) 30 tablet 3   No current facility-administered medications for this visit.    Review of Systems Review of Systems Constitutional: negative for fatigue and weight loss Respiratory:  negative for cough and wheezing Cardiovascular: negative for chest pain, fatigue and palpitations Gastrointestinal: negative for abdominal pain and change in bowel habits Genitourinary: positive for heavy periods, vaginal discharge and urge incontinence Integument/breasts  negative for nipple discharge Musculoskeletal:negative for myalgias Neurological: negative for gait problems and tremors Behavioral/Psych: negative for abusive relationship, depression Endocrine: negative for temperature intolerance      Blood pressure (!) 143/93, pulse 83, height  (1.651 m), weight 129 lb (58.5 kg), last menstrual period 04/10/2022.  Physical Exam Physical Exam General:   Alert and no distress  Skin:   no rash or abnormalities  Lungs:   clear to auscultation bilaterally  Heart:   regular rate and rhythm, S1, S2 normal, no murmur, click, rub or gallop  Breasts:   normal without suspicious masses, skin or nipple changes or axillary nodes  Abdomen:  normal findings: no organomegaly, soft, non-tender and no hernia  Pelvis:  External genitalia: normal general appearance Urinary system: urethral meatus normal and bladder without fullness, nontender Vaginal: normal without tenderness, induration or masses Cervix: normal appearance Adnexa: normal bimanual exam Uterus: anteverted and non-tender, normal size    I have spent a total of 20 minutes of face-to-face time, excluding clinical staff time, reviewing notes and preparing to see patient, ordering tests and/or medications, and counseling the patient.  Data Reviewed Labs  Assessment     1. Pelvic pain Rx: - Ambulatory referral to Urogynecology  2. Vaginal discharge Rx: - Cervicovaginal ancillary only( [place])  3. Screen for STD (sexually transmitted disease) Rx: - HIV antibody (with reflex) - RPR - Hepatitis B Surface AntiGEN - Hepatitis C Antibody  4. Urge incontinence of urine Rx: - Urine Culture - Ambulatory referral  to Urogynecology  5. Migraine without aura and without status migrainosus, not intractable - tried combination OCP's, but HA's were were worse, and she stopped taking them  6. Encounter for other general counseling and advice on contraception - options discussed.  Progestin-only contraception recommended and agreed to  7. Encounter for surveillance of contraceptive pills - migraine headaches worse with combination OCP's.  Will switch to progestin-only pill Rx: - Drospirenone (SLYND) 4 MG TABS; Take 1 tablet (4 mg total) by mouth daily before breakfast.  Dispense: 28 tablet; Refill: 11     Plan   Follow up in 3 months  Orders Placed This Encounter  Procedures   Urine Culture   HIV antibody (with reflex)   RPR   Hepatitis B Surface AntiGEN   Hepatitis C Antibody   Ambulatory referral to Urogynecology    Referral Priority:   Routine    Referral Type:   Consultation    Referral Reason:   Specialty Services Required    Requested Specialty:   Urology    Number of Visits Requested:   1   Meds ordered this encounter  Medications   Drospirenone (SLYND) 4 MG TABS    Sig: Take 1 tablet (4 mg total) by mouth daily before breakfast.    Dispense:  28 tablet    Refill:  11      Brock Bad, MD 04/28/2022 12:20 PM

## 2022-04-29 LAB — HEPATITIS B SURFACE ANTIGEN: Hepatitis B Surface Ag: NEGATIVE

## 2022-04-29 LAB — RPR: RPR Ser Ql: NONREACTIVE

## 2022-04-29 LAB — HIV ANTIBODY (ROUTINE TESTING W REFLEX): HIV Screen 4th Generation wRfx: NONREACTIVE

## 2022-04-29 LAB — HEPATITIS C ANTIBODY: Hep C Virus Ab: NONREACTIVE

## 2022-05-01 LAB — URINE CULTURE: Organism ID, Bacteria: NO GROWTH

## 2022-05-02 LAB — CERVICOVAGINAL ANCILLARY ONLY
Bacterial Vaginitis (gardnerella): NEGATIVE
Candida Glabrata: NEGATIVE
Candida Vaginitis: NEGATIVE
Chlamydia: NEGATIVE
Comment: NEGATIVE
Comment: NEGATIVE
Comment: NEGATIVE
Comment: NEGATIVE
Comment: NEGATIVE
Comment: NORMAL
Neisseria Gonorrhea: NEGATIVE
Trichomonas: NEGATIVE

## 2022-05-17 NOTE — Progress Notes (Unsigned)
Urogynecology New Patient Evaluation and Consultation  Referring Provider: Brock Bad, MD PCP: Burton Apley, MD Date of Service: 05/18/2022  SUBJECTIVE Chief Complaint: No chief complaint on file.  History of Present Illness: Catherine Estrada is a 47 y.o. {ED SANE [phone] female seen in consultation at the request of Dr. Clearance Coots for evaluation of urge incontinence.    ***Review of records significant for: ***  Urinary Symptoms: {urine leakage?:24754} Leaks *** time(s) per {days/wks/mos/yrs:310907}.  Pad use: {NUMBERS 1-10:18281} {pad option:24752} per day.   She {ACTION; IS/IS HYQ:65784696} bothered by her UI symptoms.  Day time voids ***.  Nocturia: *** times per night to void. Voiding dysfunction: she {empties:24755} her bladder well.  {DOES NOT does:27190::"does not"} use a catheter to empty bladder.  When urinating, she feels {urine symptoms:24756} Drinks: *** per day  UTIs: {NUMBERS 1-10:18281} UTI's in the last year.   {ACTIONS;DENIES/REPORTS:21021675::"Denies"} history of {urologic concerns:24757}  Pelvic Organ Prolapse Symptoms:                  She {denies/ admits to:24761} a feeling of a bulge the vaginal area. It has been present for {NUMBER 1-10:22536} {days/wks/mos/yrs:310907}.  She {denies/ admits to:24761} seeing a bulge.  This bulge {ACTION; IS/IS EXB:28413244} bothersome.  Bowel Symptom: Bowel movements: *** time(s) per {Time; day/week/month:13537} Stool consistency: {stool consistency:24758} Straining: {yes/no:19897}.  Splinting: {yes/no:19897}.  Incomplete evacuation: {yes/no:19897}.  She {denies/ admits to:24761} accidental bowel leakage / fecal incontinence  Occurs: *** time(s) per {Time; day/week/month:13537}  Consistency with leakage: {stool consistency:24758} Bowel regimen: {bowel regimen:24759} Last colonoscopy: Date ***, Results ***  Sexual Function Sexually active: {yes/no:19897}.  Sexual orientation: {Sexual  Orientation:[phone]} Pain with sex: {pain with sex:24762}  Pelvic Pain {denies/ admits to:24761} pelvic pain Location: *** Pain occurs: *** Prior pain treatment: *** Improved by: *** Worsened by: ***   Past Medical History:  Past Medical History:  Diagnosis Date   Asthma 2014   Fibroid    Gastritis    History of bilateral breast implants    summer 2022   Migraine      Past Surgical History:   Past Surgical History:  Procedure Laterality Date   VAGINOPLASTY  2012   had "vaginal tightening" after last delivery     Past OB/GYN History: G{NUMBERS 1-10:18281} P{NUMBERS 1-10:18281} Vaginal deliveries: ***,  Forceps/ Vacuum deliveries: ***, Cesarean section: *** Menopausal: {menopausal:24763} Contraception: ***. Last pap smear was ***.  Any history of abnormal pap smears: {yes/no:19897}.   Medications: She has a current medication list which includes the following prescription(s): albuterol, slynd, multivitamin, and tranexamic acid.   Allergies: Patient has No Known Allergies.   Social History:  Social History   Tobacco Use   Smoking status: Never   Smokeless tobacco: Never  Vaping Use   Vaping Use: Never used  Substance Use Topics   Alcohol use: No   Drug use: No    Relationship status: {relationship status:24764} She lives with ***.   She {ACTION; IS/IS WNU:27253664} employed ***. Regular exercise: {Yes/No:304960894} History of abuse: {Yes/No:304960894}  Family History:   Family History  Problem Relation Age of Onset   Hypertension Mother    Hypertension Father    Heart block Sister    Breast cancer Maternal Aunt    Breast cancer Paternal Aunt 43   Heart block Maternal Grandmother    Heart block Paternal Grandmother      Review of Systems: ROS   OBJECTIVE Physical Exam: There were no vitals filed for this visit.  Physical  Exam   GU / Detailed Urogynecologic Evaluation:  Pelvic Exam: Normal external female genitalia; Bartholin's and  Skene's glands normal in appearance; urethral meatus normal in appearance, no urethral masses or discharge.   CST: {gen negative/positive:315881}  Reflexes: bulbocavernosis {DESC; PRESENT/NOT PRESENT:21021351}, anocutaneous {DESC; PRESENT/NOT PRESENT:21021351} ***bilaterally.  Speculum exam reveals normal vaginal mucosa {With/Without:20273} atrophy. Cervix {exam; gyn cervix:30847}. Uterus {exam; pelvic uterus:30849}. Adnexa {exam; adnexa:12223}.    s/p hysterectomy: Speculum exam reveals normal vaginal mucosa {With/Without:20273}  atrophy and normal vaginal cuff.  Adnexa {exam; adnexa:12223}.    With apex supported, anterior compartment defect was {reduced:24765}  Pelvic floor strength {Roman # I-V:19040}/V, puborectalis {Roman # I-V:19040}/V external anal sphincter {Roman # I-V:19040}/V  Pelvic floor musculature: Right levator {Tender/Non-tender:20250}, Right obturator {Tender/Non-tender:20250}, Left levator {Tender/Non-tender:20250}, Left obturator {Tender/Non-tender:20250}  POP-Q:   POP-Q                                               Aa                                               Ba                                                 C                                                Gh                                               Pb                                               tvl                                                Ap                                               Bp                                                 D      Rectal Exam:  Normal sphincter tone, {rectocele:24766} distal rectocele, enterocoele {DESC; PRESENT/NOT PRESENT:21021351}, no rectal masses, {sign of:24767} dyssynergia when asking the patient to bear down.  Post-Void Residual (PVR) by Bladder Scan: In order to evaluate bladder emptying, we discussed obtaining a postvoid residual and she agreed to this procedure.  Procedure: The ultrasound unit was placed on the patient's abdomen in the  suprapubic region after the patient had voided. A PVR of *** ml was obtained by bladder scan.  Laboratory Results: @ENCLABS @   ***I visualized the urine specimen, noting the specimen to be {urine color:24768}  ASSESSMENT AND PLAN [name] is a 47 y.o. with: No diagnosis found.    Selmer Dominion, NP   Medical Decision Making:  - Reviewed/ ordered a clinical laboratory test - Reviewed/ ordered a radiologic study - Reviewed/ ordered medicine test - Decision to obtain old records - Discussion of management of or test interpretation with an external physician / other healthcare professional  - Assessment requiring independent historian - Review and summation of prior records - Independent review of image, tracing or specimen

## 2022-05-18 ENCOUNTER — Encounter: Payer: Self-pay | Admitting: Obstetrics and Gynecology

## 2022-05-18 ENCOUNTER — Ambulatory Visit: Payer: Medicaid Other | Admitting: Obstetrics and Gynecology

## 2022-05-18 VITALS — BP 126/81 | HR 98

## 2022-05-18 DIAGNOSIS — R350 Frequency of micturition: Secondary | ICD-10-CM | POA: Diagnosis not present

## 2022-05-18 DIAGNOSIS — R102 Pelvic and perineal pain: Secondary | ICD-10-CM | POA: Diagnosis not present

## 2022-05-18 DIAGNOSIS — M62838 Other muscle spasm: Secondary | ICD-10-CM | POA: Diagnosis not present

## 2022-05-18 LAB — POCT URINALYSIS DIPSTICK
Bilirubin, UA: NEGATIVE
Glucose, UA: NEGATIVE
Ketones, UA: NEGATIVE
Leukocytes, UA: NEGATIVE
Nitrite, UA: NEGATIVE
Protein, UA: NEGATIVE
Spec Grav, UA: 1.015 (ref 1.010–1.025)
Urobilinogen, UA: 0.2 E.U./dL
pH, UA: 6.5 (ref 5.0–8.0)

## 2022-05-18 MED ORDER — CYCLOBENZAPRINE HCL 5 MG PO TABS: 5.0000 mg | ORAL_TABLET | Freq: Three times a day (TID) | ORAL | 1 refills | Status: DC | PRN

## 2022-05-18 MED ORDER — OXYBUTYNIN CHLORIDE ER 5 MG PO TB24: 5.0000 mg | ORAL_TABLET | Freq: Every day | ORAL | 3 refills | Status: DC

## 2022-05-18 NOTE — Patient Instructions (Signed)
Catherine Estrada is the PT that I think would be a great fit for you  Start with the muscle relaxant 5mg  nightly and before intercourse.

## 2022-05-25 ENCOUNTER — Encounter: Payer: Self-pay | Admitting: Obstetrics and Gynecology

## 2022-06-15 ENCOUNTER — Encounter: Payer: Self-pay | Admitting: Obstetrics

## 2022-06-29 ENCOUNTER — Ambulatory Visit: Payer: Medicaid Other | Admitting: Obstetrics and Gynecology

## 2022-06-29 ENCOUNTER — Encounter: Payer: Self-pay | Admitting: Obstetrics and Gynecology

## 2022-06-29 VITALS — BP 111/70 | HR 103

## 2022-06-29 DIAGNOSIS — N3281 Overactive bladder: Secondary | ICD-10-CM

## 2022-06-29 DIAGNOSIS — M62838 Other muscle spasm: Secondary | ICD-10-CM | POA: Diagnosis not present

## 2022-06-29 DIAGNOSIS — R102 Pelvic and perineal pain: Secondary | ICD-10-CM

## 2022-06-29 MED ORDER — MIRABEGRON ER 50 MG PO TB24: 50.0000 mg | ORAL_TABLET | Freq: Every day | ORAL | 5 refills | Status: DC

## 2022-06-29 NOTE — Progress Notes (Signed)
[facility] Return Visit  SUBJECTIVE  History of Present Illness: Catherine Estrada is a 47 y.o. female seen in follow-up for OAB and levator spasm. Plan at last visit was start muscle relaxants to assist in levator spasm while awaiting to start PT.  Patient has a PT evaluation in July and has 4 appointments scheduled.   She reports she had an orgasm and lost urine during intercourse.   The Oxybutynin gave her significant side effects. She had dizziness, brain fog, and did not feel functional. She stopped taking due to side effects.   Past Medical History: Patient  has a past medical history of Asthma (2014), Fibroid, Gastritis, History of bilateral breast implants, and Migraine.   Past Surgical History: She  has a past surgical history that includes Vaginoplasty (2012).   Medications: She has a current medication list which includes the following prescription(s): albuterol, amitriptyline, cyclobenzaprine, slynd, mirabegron er, multivitamin, topiramate, and tranexamic acid.   Allergies: Patient has No Known Allergies.   Social History: Patient  reports that she has never smoked. She has never used smokeless tobacco. She reports current alcohol use. She reports that she does not use drugs.      OBJECTIVE     Physical Exam: Vitals:   06/29/22 0953  BP: 111/70  Pulse: (!) 103   Gen: No apparent distress, A&O x 3.  Detailed Urogynecologic Evaluation:  Deferred.    ASSESSMENT AND PLAN    Ms. Bagsby is a 47 y.o. with:  1. Overactive bladder   2. Levator spasm   3. Pain in female pelvis    Patient has tried Oxybutynin and did not do well with the medication. She is still having significant overactive bladder. As she had such significant side effects with the anticholinergic medication, would not suggest another one at this time. Myrbetriq is a beta 3 agonists without the anticholinergic properties and may be a better fit for patient. Will start patient on  Myrbetriq 50mg  daily for OAB symptoms.  Patient is to start Pelvic floor PT in July to address pelvic floor pain. She is continuing on the muscle relaxants at night to assist in the pain.  Muscle relaxants providing mild relief. Patient was able to have intercourse without pain, but did leak urine with orgasm.   Patient to follow up in 8 weeks or sooner if needed.

## 2022-06-29 NOTE — Patient Instructions (Signed)
Start Myrbetriq 50mg  daily.   Let me know if this is not helpful.

## 2022-06-30 NOTE — Progress Notes (Signed)
Patient's Myrbetriq 50mg  ER has been approved [facility]) 06/30/22 - 06/30/2023. Approved code: [phone]

## 2022-07-26 NOTE — Therapy (Unsigned)
OUTPATIENT PHYSICAL THERAPY FEMALE PELVIC EVALUATION   Patient Name: Catherine Estrada MRN: [id number] DOB:06/24/1975, 47 y.o., female Today's Date: 07/27/2022  END OF SESSION:  PT End of Session - 07/27/22 1146     Visit Number 1    Date for PT Re-Evaluation 10/19/22    Authorization Type [employer]    PT Start Time 1140    PT Stop Time 1225    PT Time Calculation (min) 45 min    Activity Tolerance Patient tolerated treatment well    Behavior During Therapy [facility] for tasks assessed/performed             Past Medical History:  Diagnosis Date   Asthma 2014   Fibroid    Gastritis    History of bilateral breast implants    summer 2022   Migraine    Past Surgical History:  Procedure Laterality Date   VAGINOPLASTY  2012   had "vaginal tightening" after last delivery   Patient Active Problem List   Diagnosis Date Noted   Encounter for counseling 12/13/2021   Vaginal discomfort 10/19/2021   Menorrhagia with regular cycle 06/15/2021   Screening breast examination 10/10/2018   Encounter to discuss test results 12/14/2016   Encounter for counseling regarding contraception 11/16/2016   Routine screening for STI (sexually transmitted infection) 11/16/2016   Fibroids 01/04/2016    PCP: Burton Apley, MD  REFERRING PROVIDER: Selmer Dominion, NP   REFERRING DIAG: [phone] (ICD-10-CM) - Levator spasm   THERAPY DIAG:  Cramp and spasm  Pelvic pain  Rationale for Evaluation and Treatment: Rehabilitation  ONSET DATE: 2016  SUBJECTIVE:                                                                                                                                                                                           SUBJECTIVE STATEMENT: Patient has been having a lot of pelvic pain and pressure to the bladder with frequent urination.  Fluid intake:  AM Coffee, Tumeric and ginger Tea and 20-40 oz Water per day    PAIN:  Are you having pain? Yes NPRS scale:  7/10 Pain location:  lower abdomen and has swelling   Pain type: sharp Pain description: intermittent   Aggravating factors: putting pressure on the abdomen, intercourse, during her cycle,  Relieving factors: hot pack, warm bath, medication  PRECAUTIONS: None  WEIGHT BEARING RESTRICTIONS: No  FALLS:  Has patient fallen in last 6 months? No  LIVING ENVIRONMENT: Lives with: lives with their family  OCCUPATION: interpreter  PLOF: Independent  PATIENT GOALS: reduce pain  PERTINENT HISTORY:  Fibroids; vaginoplasty 2012 Sexual abuse: No  BOWEL MOVEMENT: no issues Pain with bowel movement: No  URINATION: Pain with urination: No Fully empty bladder: Yes:   Stream: Strong Urgency: Yes: goes frequently Frequency: urinates every hour Leakage: Urge to void, Walking to the bathroom, Intercourse, and full bladder; 2 times per day Pads: Yes: 2 pantyliners  INTERCOURSE: Pain with intercourse: After Intercourse and deep penetration Ability to have vaginal penetration:  Yes:   Climax: yes Marinoff Scale: 2/3  PREGNANCY: Vaginal deliveries 2 Tearing Yes: not sure how big the tears was  PROLAPSE: None   OBJECTIVE:   DIAGNOSTIC FINDINGS:  Pelvic floor strength I/V, puborectalis    Pelvic floor musculature: Right levator non-tender, Right obturator tender, Left levator tender, Left obturator tender. Also tender at opening of introitus.   PVR of 0 ml was obtained by bladder scan.   PATIENT SURVEYS:  UIQ-7 18; POPIQ-7 43 PFIQ-7 62  COGNITION: Overall cognitive status: Within functional limits for tasks assessed     SENSATION: Light touch: Appears intact Proprioception: Appears intact   POSTURE: No Significant postural limitations  PELVIC ALIGNMENT: ASIS are equal  LUMBARAROM/PROM: Lumbar ROM is full   LOWER EXTREMITY ROM:  Passive ROM Right eval Left eval  Hip external rotation 40 40   (Blank rows = not tested)  LOWER EXTREMITY MMT:  MMT  Right eval Left eval  Hip extension 4/5 4/5  Hip abduction 4/5 4/5   PALPATION:   General  will lift up her rib cage to contract the abdomen and not able to isolate them.                 External Perineal Exam no tenderness, redness in the posterior fourchette,                              Internal Pelvic Floor tenderness located on bilateral levator ani, obturator internist, along the bladder, along the urethra  Patient confirms identification and approves PT to assess internal pelvic floor and treatment Yes  PELVIC MMT:   MMT eval  Vaginal 1/5  (Blank rows = not tested)        TONE: increased  PROLAPSE: none  TODAY'S TREATMENT:                                                                                                                              DATE: 07/27/22  EVAL see below   PATIENT EDUCATION:  07/27/22 Education details: Access Code: [email or web address] Person educated: Patient Education method: Programmer, multimedia, Facilities manager, Actor cues, Verbal cues, and Handouts Education comprehension: verbalized understanding, returned demonstration, verbal cues required, tactile cues required, and needs further education  HOME EXERCISE PROGRAM: 07/27/22 Access Code: [email or web address] URL: [email or web address] Date: 07/27/2022 Prepared by: Eulis Foster  Exercises - Supine Diaphragmatic Breathing  - 2 x daily - 7 x weekly - 1 sets - 10 reps - Seated Diaphragmatic Breathing  - 2 x daily - 7 x  weekly - 1 sets - 10 reps  ASSESSMENT:  CLINICAL IMPRESSION: Patient is a 47 y.o. female who was seen today for physical therapy evaluation and treatment for levator spasm. Patient has had pelvic pain for 8 years. She has a history of Fibroids and feels they are growing. She reports her pelvic and lower abdominal pain is intermittent at level 7/10 and worse with vaginal penetration, during her cycle, and pressure on the abdomen. She has tenderness located in the lower abdomen, bilateral  levator ani, obturator internist, along the urethra, bladder, and firmness on the posterior vaginal wall. Her pelvic floor strength is 1/5. She has increased tone in the pelvic floor muscles. Patient has to urinate every hour due to urgency and will leak urine with intercourse and when she has the urge to void. Patient will benefit from skilled therapy to reduce her pain, improve pelvic floor coordination and improve quality of life.   OBJECTIVE IMPAIRMENTS: decreased activity tolerance, decreased coordination, decreased strength, increased fascial restrictions, increased muscle spasms, and pain.   ACTIVITY LIMITATIONS: carrying, lifting, standing, squatting, continence, toileting, and locomotion level  PARTICIPATION LIMITATIONS: meal prep, cleaning, laundry, driving, shopping, community activity, and occupation  PERSONAL FACTORS: 1 comorbidity: fibroids  are also affecting patient's functional outcome.   REHAB POTENTIAL: Excellent  CLINICAL DECISION MAKING: Evolving/moderate complexity  EVALUATION COMPLEXITY: Moderate   GOALS: Goals reviewed with patient? Yes  SHORT TERM GOALS: Target date: 08/23/22  Patient independent with initial HEP for meditation, hip stretches, diaphragmatic breathing to reduce pain.  Baseline:Not educated yet Goal status: INITIAL   LONG TERM GOALS: Target date: 10/19/22  Patient independent with advanced HEP  for pain management, self massage to pelvic floor to reduce trigger points.  Baseline: Not educated yet Goal status: INITIAL  2.  Patient is able to walk to the bathroom after the urge to urinate without leaking urine due to the ability to contract her pelvic floor stronger than >/= 3/5 and fully relax.  Baseline: Pelvic floor strength is 1/5 Goal status: INITIAL  3.  Patient is able to wait 2-3 hours to urinate due to reduction of trigger points in the pelvic floor.  Baseline: urinates every hour Goal status: INITIAL  4.  Patient is educated on  bladder irritants to reduce the urgency to urinate.  Baseline: not educated yet Goal status: INITIAL  5.  PFIQ-7 score is </= 40 due to the reduction of pain and urinary leakage. Baseline: PFIQ-7 62 Goal status: INITIAL   PLAN:  PT FREQUENCY: 1-2x/week  PT DURATION: 12 weeks  PLANNED INTERVENTIONS: Therapeutic exercises, Therapeutic activity, Neuromuscular re-education, Patient/Family education, Joint mobilization, Dry Needling, Electrical stimulation, Spinal mobilization, Cryotherapy, Taping, Ultrasound, Biofeedback, and Manual therapy  PLAN FOR NEXT SESSION: manual work to the abdomen, work on diaphragmatic breathing, hip stretches, release of the urogenital diaphram   Eulis Foster, PT 07/27/22 12:52 PM

## 2022-07-27 ENCOUNTER — Encounter: Payer: Medicaid Other | Attending: Obstetrics and Gynecology | Admitting: Physical Therapy

## 2022-07-27 ENCOUNTER — Encounter: Payer: Self-pay | Admitting: Physical Therapy

## 2022-07-27 ENCOUNTER — Other Ambulatory Visit: Payer: Self-pay

## 2022-07-27 DIAGNOSIS — R252 Cramp and spasm: Secondary | ICD-10-CM | POA: Insufficient documentation

## 2022-07-27 DIAGNOSIS — R102 Pelvic and perineal pain: Secondary | ICD-10-CM | POA: Insufficient documentation

## 2022-08-03 ENCOUNTER — Encounter: Payer: Self-pay | Admitting: Physical Therapy

## 2022-08-03 ENCOUNTER — Encounter: Payer: Medicaid Other | Admitting: Physical Therapy

## 2022-08-03 DIAGNOSIS — R102 Pelvic and perineal pain: Secondary | ICD-10-CM

## 2022-08-03 DIAGNOSIS — R252 Cramp and spasm: Secondary | ICD-10-CM | POA: Diagnosis not present

## 2022-08-03 NOTE — Therapy (Signed)
OUTPATIENT PHYSICAL THERAPY FEMALE PELVIC TREATMENT   Patient Name: Catherine Estrada MRN: [id number] DOB:11/12/1975, 47 y.o., female Today's Date: 08/03/2022  END OF SESSION:  PT End of Session - 08/03/22 1137     Visit Number 2    Date for PT Re-Evaluation 10/19/22    Authorization Type [employer]    Authorization Time Period 07/27/2022-09/24/2022    Authorization - Visit Number 1    Authorization - Number of Visits 5    PT Start Time 1135    PT Stop Time 1215    PT Time Calculation (min) 40 min    Activity Tolerance Patient tolerated treatment well    Behavior During Therapy [facility] for tasks assessed/performed             Past Medical History:  Diagnosis Date   Asthma 2014   Fibroid    Gastritis    History of bilateral breast implants    summer 2022   Migraine    Past Surgical History:  Procedure Laterality Date   VAGINOPLASTY  2012   had "vaginal tightening" after last delivery   Patient Active Problem List   Diagnosis Date Noted   Encounter for counseling 12/13/2021   Vaginal discomfort 10/19/2021   Menorrhagia with regular cycle 06/15/2021   Screening breast examination 10/10/2018   Encounter to discuss test results 12/14/2016   Encounter for counseling regarding contraception 11/16/2016   Routine screening for STI (sexually transmitted infection) 11/16/2016   Fibroids 01/04/2016    PCP: Burton Apley, MD  REFERRING PROVIDER: Selmer Dominion, NP   REFERRING DIAG: [phone] (ICD-10-CM) - Levator spasm   THERAPY DIAG:  Cramp and spasm  Pelvic pain  Rationale for Evaluation and Treatment: Rehabilitation  ONSET DATE: 2016  SUBJECTIVE:                                                                                                                                                                                           SUBJECTIVE STATEMENT: I ordered the wand.   PAIN:  Are you having pain? Yes NPRS scale: 7/10 Pain location:  lower abdomen and  has swelling   Pain type: sharp Pain description: intermittent   Aggravating factors: putting pressure on the abdomen, intercourse, during her cycle,  Relieving factors: hot pack, warm bath, medication  PRECAUTIONS: None  WEIGHT BEARING RESTRICTIONS: No  FALLS:  Has patient fallen in last 6 months? No  LIVING ENVIRONMENT: Lives with: lives with their family  OCCUPATION: interpreter  PLOF: Independent  PATIENT GOALS: reduce pain  PERTINENT HISTORY:  Fibroids; vaginoplasty 2012 Sexual abuse: No  BOWEL MOVEMENT: no issues Pain with  bowel movement: No  URINATION: Pain with urination: No Fully empty bladder: Yes:   Stream: Strong Urgency: Yes: goes frequently Frequency: urinates every hour Leakage: Urge to void, Walking to the bathroom, Intercourse, and full bladder; 2 times per day Pads: Yes: 2 pantyliners  INTERCOURSE: Pain with intercourse: After Intercourse and deep penetration Ability to have vaginal penetration:  Yes:   Climax: yes Marinoff Scale: 2/3  PREGNANCY: Vaginal deliveries 2 Tearing Yes: not sure how big the tears was  PROLAPSE: None   OBJECTIVE:   DIAGNOSTIC FINDINGS:  Pelvic floor strength I/V, puborectalis    Pelvic floor musculature: Right levator non-tender, Right obturator tender, Left levator tender, Left obturator tender. Also tender at opening of introitus.   PVR of 0 ml was obtained by bladder scan.   PATIENT SURVEYS:  UIQ-7 18; POPIQ-7 43 PFIQ-7 62  COGNITION: Overall cognitive status: Within functional limits for tasks assessed     SENSATION: Light touch: Appears intact Proprioception: Appears intact   POSTURE: No Significant postural limitations  PELVIC ALIGNMENT: ASIS are equal  LUMBARAROM/PROM: Lumbar ROM is full   LOWER EXTREMITY ROM:  Passive ROM Right eval Left eval  Hip external rotation 40 40   (Blank rows = not tested)  LOWER EXTREMITY MMT:  MMT Right eval Left eval  Hip extension 4/5 4/5   Hip abduction 4/5 4/5   PALPATION:   General  will lift up her rib cage to contract the abdomen and not able to isolate them.                 External Perineal Exam no tenderness, redness in the posterior fourchette,                              Internal Pelvic Floor tenderness located on bilateral levator ani, obturator internist, along the bladder, along the urethra  Patient confirms identification and approves PT to assess internal pelvic floor and treatment Yes  PELVIC MMT:   MMT eval  Vaginal 1/5  (Blank rows = not tested)        TONE: increased  PROLAPSE: none  TODAY'S TREATMENT:   08/03/22 Manual: Soft tissue mobilization: Circular massage to abdomen  Manual work to the diaphragm with breath work Myofascial release: Fascial release around the lower abdomen and suprapubically going through the layers of fascia Supine pulling from the pubic bone to the naval to reduce the pressure on the pelvic floor and educated her on how to perform to reduce pressure Exercises: Stretches/mobility: Sitting piriformis stretch holding for 30 sec bil.  Sitting hamstring stretch holding for 30 sec bil.  Sitting happy baby holding for 30 sec Marjo Bicker pose holding 30 sec with breathing into the back Laying on back with legs elevated to reduce pressure on the pelvic floor Educated patient on how to perform perineal massage, hip adductor massage and gluteal to reduce trigger points in the pelvic floor Diaphragmatic breathing to elongate the diaphragm and pelvic floor  DATE: 07/27/22  EVAL see below   PATIENT EDUCATION:  08/03/22 Education details: Access Code: [email or web address], perineal massage and pelvic meditation Person educated: Patient Education method: Explanation, Demonstration, Tactile cues, Verbal cues, and Handouts, you tube video Education comprehension:  verbalized understanding, returned demonstration, verbal cues required, tactile cues required, and needs further education  HOME EXERCISE PROGRAM: 08/03/22 Access Code: [email or web address] URL: [email or web address] Date: 08/03/2022 Prepared by: Eulis Foster  Exercises - Supine Diaphragmatic Breathing  - 2 x daily - 7 x weekly - 1 sets - 10 reps - Seated Diaphragmatic Breathing  - 2 x daily - 7 x weekly - 1 sets - 10 reps - Seated Piriformis Stretch with Trunk Bend  - 1 x daily - 7 x weekly - 1 sets - 2 reps - 30 sec hold - Seated Hamstring Stretch  - 1 x daily - 7 x weekly - 1 sets - 2 reps - 30 sec hold - Seated Happy Baby With Trunk Flexion For Pelvic Relaxation  - 1 x daily - 7 x weekly - 1 sets - 1 reps - 30 sec hold - Child's Pose Stretch  - 1 x daily - 7 x weekly - 1 sets - 2 reps - 30 sec hold   ASSESSMENT:  CLINICAL IMPRESSION: Patient is a 47 y.o. female who was seen today for physical therapy  treatment for levator spasm.  Patient was able to perform diaphragmatic breathing by the end of the treatment and feel the pelvic floor relax. She understands ways to manage her pain when at home. She has a golf ball lump in the left lower quadrant and she reports she thinks it is one of her fibroids. Patient will benefit from skilled therapy to reduce her pain, improve pelvic floor coordination and improve quality of life.   OBJECTIVE IMPAIRMENTS: decreased activity tolerance, decreased coordination, decreased strength, increased fascial restrictions, increased muscle spasms, and pain.   ACTIVITY LIMITATIONS: carrying, lifting, standing, squatting, continence, toileting, and locomotion level  PARTICIPATION LIMITATIONS: meal prep, cleaning, laundry, driving, shopping, community activity, and occupation  PERSONAL FACTORS: 1 comorbidity: fibroids  are also affecting patient's functional outcome.   REHAB POTENTIAL: Excellent  CLINICAL DECISION MAKING: Evolving/moderate  complexity  EVALUATION COMPLEXITY: Moderate   GOALS: Goals reviewed with patient? Yes  SHORT TERM GOALS: Target date: 08/23/22  Patient independent with initial HEP for meditation, hip stretches, diaphragmatic breathing to reduce pain.  Baseline:Not educated yet Goal status: INITIAL   LONG TERM GOALS: Target date: 10/19/22  Patient independent with advanced HEP  for pain management, self massage to pelvic floor to reduce trigger points.  Baseline: Not educated yet Goal status: INITIAL  2.  Patient is able to walk to the bathroom after the urge to urinate without leaking urine due to the ability to contract her pelvic floor stronger than >/= 3/5 and fully relax.  Baseline: Pelvic floor strength is 1/5 Goal status: INITIAL  3.  Patient is able to wait 2-3 hours to urinate due to reduction of trigger points in the pelvic floor.  Baseline: urinates every hour Goal status: INITIAL  4.  Patient is educated on bladder irritants to reduce the urgency to urinate.  Baseline: not educated yet Goal status: INITIAL  5.  PFIQ-7 score is </= 40 due to the reduction of pain and urinary leakage. Baseline: PFIQ-7 62 Goal status: INITIAL   PLAN:  PT FREQUENCY: 1-2x/week  PT DURATION: 12 weeks  PLANNED INTERVENTIONS: Therapeutic exercises, Therapeutic activity, Neuromuscular re-education, Patient/Family education, Joint mobilization, Dry  Needling, Electrical stimulation, Spinal mobilization, Cryotherapy, Taping, Ultrasound, Biofeedback, and Manual therapy  PLAN FOR NEXT SESSION: manual work to the abdomen,  release of the urogenital diaphram   Eulis Foster, PT 08/03/22 11:38 AM

## 2022-08-15 ENCOUNTER — Ambulatory Visit: Payer: Medicaid Other | Admitting: Obstetrics and Gynecology

## 2022-08-15 ENCOUNTER — Encounter: Payer: Self-pay | Admitting: Obstetrics and Gynecology

## 2022-08-15 VITALS — BP 124/84 | HR 114 | Ht 65.0 in | Wt 131.4 lb

## 2022-08-15 DIAGNOSIS — N920 Excessive and frequent menstruation with regular cycle: Secondary | ICD-10-CM | POA: Diagnosis not present

## 2022-08-15 DIAGNOSIS — D219 Benign neoplasm of connective and other soft tissue, unspecified: Secondary | ICD-10-CM

## 2022-08-15 DIAGNOSIS — R102 Pelvic and perineal pain: Secondary | ICD-10-CM | POA: Diagnosis not present

## 2022-08-15 MED ORDER — MEGESTROL ACETATE 40 MG PO TABS: 40.0000 mg | ORAL_TABLET | Freq: Two times a day (BID) | ORAL | 5 refills | Status: DC

## 2022-08-15 NOTE — Progress Notes (Signed)
Pt wants to discuss taking Lysteda with Slynd.

## 2022-08-15 NOTE — Progress Notes (Signed)
  CC: fibroids Subjective:    Patient ID: Catherine Estrada, female    DOB: 01-16-1976, 47 y.o.   MRN: [id number]  HPI 47 yo G3P2 seen for discussion of fibroids, irregular bleeding and pain.  The patient notes 3 rounds of bleeding this month.  Patient notes some urge symptoms as well.  She would like some form of intervention as she is uncomfortable.   Review of Systems     Objective:   Physical Exam Vitals:   08/15/22 1335  BP: 124/84  Pulse: (!) 114         Assessment & Plan:   1. Menorrhagia with regular cycle Discussed treatment options with the patient.  She is not a candidate for progesterone IUD as she has tried it before and did not tolerate.  Milinda Pointer or [employer] treatment Fibroid embolization Hysterectomy (last resort)  Pt was concerned regarding Slynd and lysteda together.  She will discontinue slynd after this pack and start megace until decision is made for treatment. Pt will be scheduled for current ultrasound to valuate fibroid size.  - US PELVIC COMPLETE WITH TRANSVAGINAL; Future - megestrol (MEGACE) 40 MG tablet; Take 1 tablet (40 mg total) by mouth 2 (two) times daily. Can increase to two tablets twice a day in the event of heavy bleeding  Dispense: 60 tablet; Refill: 5  2. Fibroids See above - US PELVIC COMPLETE WITH TRANSVAGINAL; Future  3. Pelvic pain in female   F/u in 1 month  I spent 30 minutes dedicated to the care of this patient including previsit review of records, face to face time with the patient discussing treatment options and post visit testing.       Warden Fillers, MD Faculty Attending, [facility]

## 2022-08-15 NOTE — Patient Instructions (Addendum)
[email or web address]  (microwave fibroids)  [email or web address] ( medication for fibroids and bleeding, different than the one I have prescribed)

## 2022-08-17 ENCOUNTER — Encounter: Payer: Self-pay | Admitting: Physical Therapy

## 2022-08-17 ENCOUNTER — Encounter: Payer: Medicaid Other | Attending: Obstetrics and Gynecology | Admitting: Physical Therapy

## 2022-08-17 DIAGNOSIS — R102 Pelvic and perineal pain: Secondary | ICD-10-CM

## 2022-08-17 DIAGNOSIS — R252 Cramp and spasm: Secondary | ICD-10-CM

## 2022-08-17 NOTE — Therapy (Signed)
OUTPATIENT PHYSICAL THERAPY FEMALE PELVIC TREATMENT   Patient Name: Catherine Estrada MRN: [id number] DOB:[birth year], 47 y.o., female Today's Date: 08/17/2022  END OF SESSION:  PT End of Session - 08/17/22 1134     Visit Number 3    Date for PT Re-Evaluation 10/19/22    Authorization Type [employer]    Authorization Time Period 07/27/2022-09/24/2022    Authorization - Visit Number 2    Authorization - Number of Visits 5    PT Start Time 1130    PT Stop Time 1215    PT Time Calculation (min) 45 min    Activity Tolerance Patient tolerated treatment well    Behavior During Therapy [facility] for tasks assessed/performed             Past Medical History:  Diagnosis Date   Asthma 2014   Fibroid    Gastritis    History of bilateral breast implants    summer 2022   Migraine    Past Surgical History:  Procedure Laterality Date   VAGINOPLASTY  2012   had "vaginal tightening" after last delivery   Patient Active Problem List   Diagnosis Date Noted   Encounter for counseling 12/13/2021   Vaginal discomfort 10/19/2021   Menorrhagia with regular cycle 06/15/2021   Screening breast examination 10/10/2018   Encounter to discuss test results 12/14/2016   Encounter for counseling regarding contraception 11/16/2016   Routine screening for STI (sexually transmitted infection) 11/16/2016   Fibroids 01/04/2016    PCP: Burton Apley, MD  REFERRING PROVIDER: Selmer Dominion, NP   REFERRING DIAG: [phone] (ICD-10-CM) - Levator spasm   THERAPY DIAG:  Cramp and spasm  Pelvic pain  Rationale for Evaluation and Treatment: Rehabilitation  ONSET DATE: 2016  SUBJECTIVE:                                                                                                                                                                                           SUBJECTIVE STATEMENT: I have had my period all month long. I have not been abel to use the wand. Urgency is less. The exercises are  helping. Still have some urinary leakage if she does not make it to the bathroom in time.    PAIN:  Are you having pain? Yes NPRS scale: 7/10 Pain location:  lower abdomen and has swelling   Pain type: sharp Pain description: intermittent   Aggravating factors: putting pressure on the abdomen, intercourse, during her cycle,  Relieving factors: hot pack, warm bath, medication  PRECAUTIONS: None  WEIGHT BEARING RESTRICTIONS: No  FALLS:  Has patient fallen in last 6 months? No  LIVING ENVIRONMENT: Lives with: lives with their family  OCCUPATION: interpreter  PLOF: Independent  PATIENT GOALS: reduce pain  PERTINENT HISTORY:  Fibroids; vaginoplasty 2012 Sexual abuse: No  BOWEL MOVEMENT: no issues Pain with bowel movement: No  URINATION: Pain with urination: No Fully empty bladder: Yes:   Stream: Strong Urgency: Yes: goes frequently Frequency: urinates every hour Leakage: Urge to void, Walking to the bathroom, Intercourse, and full bladder; 2 times per day Pads: Yes: 2 pantyliners  INTERCOURSE: Pain with intercourse: After Intercourse and deep penetration Ability to have vaginal penetration:  Yes:   Climax: yes Marinoff Scale: 2/3  PREGNANCY: Vaginal deliveries 2 Tearing Yes: not sure how big the tears was  PROLAPSE: None   OBJECTIVE:   DIAGNOSTIC FINDINGS:  Pelvic floor strength I/V, puborectalis    Pelvic floor musculature: Right levator non-tender, Right obturator tender, Left levator tender, Left obturator tender. Also tender at opening of introitus.   PVR of 0 ml was obtained by bladder scan.   PATIENT SURVEYS:  UIQ-7 18; POPIQ-7 43 PFIQ-7 62  COGNITION: Overall cognitive status: Within functional limits for tasks assessed     SENSATION: Light touch: Appears intact Proprioception: Appears intact   POSTURE: No Significant postural limitations  PELVIC ALIGNMENT: ASIS are equal  LUMBARAROM/PROM: Lumbar ROM is full   LOWER EXTREMITY  ROM:  Passive ROM Right eval Left eval  Hip external rotation 40 40   (Blank rows = not tested)  LOWER EXTREMITY MMT:  MMT Right eval Left eval  Hip extension 4/5 4/5  Hip abduction 4/5 4/5   PALPATION:   General  will lift up her rib cage to contract the abdomen and not able to isolate them.                 External Perineal Exam no tenderness, redness in the posterior fourchette,                              Internal Pelvic Floor tenderness located on bilateral levator ani, obturator internist, along the bladder, along the urethra  Patient confirms identification and approves PT to assess internal pelvic floor and treatment Yes  PELVIC MMT:   MMT eval  Vaginal 1/5  (Blank rows = not tested)        TONE: increased  PROLAPSE: none  TODAY'S TREATMENT:   08/17/22 Manual: Myofascial release: Fascial release of the urogenital diaphragm with left tighter than right.  Internal pelvic floor techniques: No emotional/communication barriers or cognitive limitation. Patient is motivated to learn. Patient understands and agrees with treatment goals and plan. PT explains patient will be examined in standing, sitting, and lying down to see how their muscles and joints work. When they are ready, they will be asked to remove their underwear so PT can examine their perineum. The patient is also given the option of providing their own chaperone as one is not provided in our facility. The patient also has the right and is explained the right to defer or refuse any part of the evaluation or treatment including the internal exam. With the patient's consent, PT will use one gloved finger to gently assess the muscles of the pelvic floor, seeing how well it contracts and relaxes and if there is muscle symmetry. After, the patient will get dressed and PT and patient will discuss exam findings and plan of care. PT and patient discuss plan of care, schedule, attendance policy and HEP activities.  Going through the vaginal canal working on the levator ani, perineal body, along the obturator internist and iliococcygeus while monitoring for pain level and going very slowly.  Fascial release along the sides of the urethra and bladder with left side having more pain than right  08/03/22 Manual: Soft tissue mobilization: Circular massage to abdomen  Manual work to the diaphragm with breath work Myofascial release: Fascial release around the lower abdomen and suprapubically going through the layers of fascia Supine pulling from the pubic bone to the naval to reduce the pressure on the pelvic floor and educated her on how to perform to reduce pressure Exercises: Stretches/mobility: Sitting piriformis stretch holding for 30 sec bil.  Sitting hamstring stretch holding for 30 sec bil.  Sitting happy baby holding for 30 sec Childs pose holding 30 sec with breathing into the back Laying on back with legs elevated to reduce pressure on the pelvic floor Educated patient on how to perform perineal massage, hip adductor massage and gluteal to reduce trigger points in the pelvic floor Diaphragmatic breathing to elongate the diaphragm and pelvic floor                                                                                                                                PATIENT EDUCATION:  08/03/22 Education details: Access Code: [email or web address], perineal massage and pelvic meditation Person educated: Patient Education method: Explanation, Demonstration, Tactile cues, Verbal cues, and Handouts, you tube video Education comprehension: verbalized understanding, returned demonstration, verbal cues required, tactile cues required, and needs further education  HOME EXERCISE PROGRAM: 08/03/22 Access Code: [email or web address] URL: [email or web address] Date: 08/03/2022 Prepared by: Eulis Foster  Exercises - Supine Diaphragmatic Breathing  - 2 x daily - 7 x weekly - 1 sets - 10 reps - Seated  Diaphragmatic Breathing  - 2 x daily - 7 x weekly - 1 sets - 10 reps - Seated Piriformis Stretch with Trunk Bend  - 1 x daily - 7 x weekly - 1 sets - 2 reps - 30 sec hold - Seated Hamstring Stretch  - 1 x daily - 7 x weekly - 1 sets - 2 reps - 30 sec hold - Seated Happy Baby With Trunk Flexion For Pelvic Relaxation  - 1 x daily - 7 x weekly - 1 sets - 1 reps - 30 sec hold - Child's Pose Stretch  - 1 x daily - 7 x weekly - 1 sets - 2 reps - 30 sec hold   ASSESSMENT:  CLINICAL IMPRESSION: Patient is a 46 y.o. female who was seen today for physical therapy  treatment for levator spasm.  Patient will be having an ultrasound due to the fibroids and having her cycle for a month. She reports her urgency is less with doing the exercises. Her left pelvic floor has more trigger points than the right. She has firm areas above the bladder that were very tender.  Patient will benefit from skilled therapy  to reduce her pain, improve pelvic floor coordination and improve quality of life.   OBJECTIVE IMPAIRMENTS: decreased activity tolerance, decreased coordination, decreased strength, increased fascial restrictions, increased muscle spasms, and pain.   ACTIVITY LIMITATIONS: carrying, lifting, standing, squatting, continence, toileting, and locomotion level  PARTICIPATION LIMITATIONS: meal prep, cleaning, laundry, driving, shopping, community activity, and occupation  PERSONAL FACTORS: 1 comorbidity: fibroids  are also affecting patient's functional outcome.   REHAB POTENTIAL: Excellent  CLINICAL DECISION MAKING: Evolving/moderate complexity  EVALUATION COMPLEXITY: Moderate   GOALS: Goals reviewed with patient? Yes  SHORT TERM GOALS: Target date: 08/23/22  Patient independent with initial HEP for meditation, hip stretches, diaphragmatic breathing to reduce pain.  Baseline:Not educated yet Goal status: Met 08/17/22   LONG TERM GOALS: Target date: 10/19/22  Patient independent with advanced HEP  for  pain management, self massage to pelvic floor to reduce trigger points.  Baseline: Not educated yet Goal status: INITIAL  2.  Patient is able to walk to the bathroom after the urge to urinate without leaking urine due to the ability to contract her pelvic floor stronger than >/= 3/5 and fully relax.  Baseline: Pelvic floor strength is 1/5 Goal status: INITIAL  3.  Patient is able to wait 2-3 hours to urinate due to reduction of trigger points in the pelvic floor.  Baseline: urinates every hour Goal status: INITIAL  4.  Patient is educated on bladder irritants to reduce the urgency to urinate.  Baseline: not educated yet Goal status: INITIAL  5.  PFIQ-7 score is </= 40 due to the reduction of pain and urinary leakage. Baseline: PFIQ-7 62 Goal status: INITIAL   PLAN:  PT FREQUENCY: 1-2x/week  PT DURATION: 12 weeks  PLANNED INTERVENTIONS: Therapeutic exercises, Therapeutic activity, Neuromuscular re-education, Patient/Family education, Joint mobilization, Dry Needling, Electrical stimulation, Spinal mobilization, Cryotherapy, Taping, Ultrasound, Biofeedback, and Manual therapy  PLAN FOR NEXT SESSION: manual work to the abdomen,  release of the urogenital diaphragm, manual work to the pelvic floor, review how to use the vaginal wand, see what the ultrasound says   Eulis Foster, PT 08/17/22 11:34 AM

## 2022-08-22 ENCOUNTER — Ambulatory Visit (HOSPITAL_COMMUNITY)
Admission: RE | Admit: 2022-08-22 | Discharge: 2022-08-22 | Disposition: A | Discharge status: Home / Self Care | Payer: Medicaid Other | Source: Ambulatory Visit | Attending: Obstetrics and Gynecology | Admitting: Obstetrics and Gynecology

## 2022-08-22 DIAGNOSIS — D219 Benign neoplasm of connective and other soft tissue, unspecified: Secondary | ICD-10-CM | POA: Diagnosis present

## 2022-08-22 DIAGNOSIS — N920 Excessive and frequent menstruation with regular cycle: Secondary | ICD-10-CM | POA: Insufficient documentation

## 2022-08-24 ENCOUNTER — Encounter: Payer: Self-pay | Admitting: Obstetrics and Gynecology

## 2022-08-29 ENCOUNTER — Ambulatory Visit: Payer: Medicaid Other | Admitting: Obstetrics and Gynecology

## 2022-08-31 ENCOUNTER — Encounter: Payer: Medicaid Other | Admitting: Physical Therapy

## 2022-09-12 ENCOUNTER — Encounter: Payer: Medicaid Other | Admitting: Physical Therapy

## 2022-09-12 ENCOUNTER — Encounter: Payer: Self-pay | Admitting: Physical Therapy

## 2022-09-12 DIAGNOSIS — R102 Pelvic and perineal pain: Secondary | ICD-10-CM

## 2022-09-12 DIAGNOSIS — R252 Cramp and spasm: Secondary | ICD-10-CM | POA: Diagnosis not present

## 2022-09-12 NOTE — Therapy (Signed)
OUTPATIENT PHYSICAL THERAPY FEMALE PELVIC TREATMENT   Patient Name: Catherine Estrada MRN: [id number] DOB:[birth year], 47 y.o., female Today's Date: 09/12/2022  END OF SESSION:  PT End of Session - 09/12/22 0937     Visit Number 4    Date for PT Re-Evaluation 10/19/22    Authorization Type [employer]    Authorization Time Period 07/27/2022-09/24/2022    Authorization - Visit Number 3    Authorization - Number of Visits 5    PT Start Time 0930    PT Stop Time 1015    PT Time Calculation (min) 45 min    Activity Tolerance Patient tolerated treatment well    Behavior During Therapy [facility] for tasks assessed/performed             Past Medical History:  Diagnosis Date   Asthma 2014   Fibroid    Gastritis    History of bilateral breast implants    summer 2022   Migraine    Past Surgical History:  Procedure Laterality Date   VAGINOPLASTY  2012   had "vaginal tightening" after last delivery   Patient Active Problem List   Diagnosis Date Noted   Encounter for counseling 12/13/2021   Vaginal discomfort 10/19/2021   Menorrhagia with regular cycle 06/15/2021   Screening breast examination 10/10/2018   Encounter to discuss test results 12/14/2016   Encounter for counseling regarding contraception 11/16/2016   Routine screening for STI (sexually transmitted infection) 11/16/2016   Fibroids 01/04/2016    PCP: Burton Apley, MD  REFERRING PROVIDER: Selmer Dominion, NP   REFERRING DIAG: [phone] (ICD-10-CM) - Levator spasm   THERAPY DIAG:  Cramp and spasm  Pelvic pain  Rationale for Evaluation and Treatment: Rehabilitation  ONSET DATE: 2016  SUBJECTIVE:                                                                                                                                                                                           SUBJECTIVE STATEMENT: The ultrasound found several fibroids and some are over 5 cm. I see the doctor on 9/5 to discuss the results.  My abdomen swells at night. The urinary leakage is less.   PAIN:  Are you having pain? Yes NPRS scale: 7/10, at night gets to a 10/10  Pain location:  lower abdomen and has swelling   Pain type: sharp Pain description: intermittent   Aggravating factors: putting pressure on the abdomen, intercourse, during her cycle,  Relieving factors: hot pack, warm bath, medication  PRECAUTIONS: None  WEIGHT BEARING RESTRICTIONS: No  FALLS:  Has patient fallen in last 6 months? No  LIVING ENVIRONMENT:  Lives with: lives with their family  OCCUPATION: interpreter  PLOF: Independent  PATIENT GOALS: reduce pain  PERTINENT HISTORY:  Fibroids; vaginoplasty 2012 Sexual abuse: No  BOWEL MOVEMENT: no issues Pain with bowel movement: No  URINATION: Pain with urination: No Fully empty bladder: Yes:   Stream: Strong Urgency: Yes: goes frequently Frequency: urinates every hour Leakage: Urge to void, Walking to the bathroom, Intercourse, and full bladder; 2 times per day Pads: Yes: 2 pantyliners  INTERCOURSE: Pain with intercourse: After Intercourse and deep penetration Ability to have vaginal penetration:  Yes:   Climax: yes Marinoff Scale: 2/3  PREGNANCY: Vaginal deliveries 2 Tearing Yes: not sure how big the tears was  PROLAPSE: None   OBJECTIVE:   DIAGNOSTIC FINDINGS:  Pelvic floor strength I/V, puborectalis    Pelvic floor musculature: Right levator non-tender, Right obturator tender, Left levator tender, Left obturator tender. Also tender at opening of introitus.   PVR of 0 ml was obtained by bladder scan.   PATIENT SURVEYS:  UIQ-7 18; POPIQ-7 43 PFIQ-7 62 09/12/22 UIQ-7 57; POPIQ-7 86 PFIQ-7 143  COGNITION: Overall cognitive status: Within functional limits for tasks assessed     SENSATION: Light touch: Appears intact Proprioception: Appears intact   POSTURE: No Significant postural limitations  PELVIC ALIGNMENT: ASIS are equal  LUMBARAROM/PROM: Lumbar  ROM is full   LOWER EXTREMITY ROM:  Passive ROM Right eval Left eval  Hip external rotation 40 40   (Blank rows = not tested)  LOWER EXTREMITY MMT:  MMT Right eval Left eval Right 09/12/22 Left  09/12/22  Hip extension 4/5 4/5 5/5 4/5  Hip abduction 4/5 4/5 4+/5 4/5   PALPATION:   General  will lift up her rib cage to contract the abdomen and not able to isolate them.  09/12/22 tenderness throughout the abdomen especially on the left lower quadrant.Firmness below the umbilicus and above the pubic bone where the uterus is located.                 External Perineal Exam no tenderness, redness in the posterior fourchette,  09/12/22 Tenderness located in the ischiocavernosus, bulbocavernosus, levator ani, and obturator internist                             Internal Pelvic Floor tenderness located on bilateral levator ani, obturator internist, along the bladder, along the urethra 09/12/22 no internal assessment due to the increased pain  Patient confirms identification and approves PT to assess internal pelvic floor and treatment Yes  PELVIC MMT:   MMT eval  Vaginal 1/5  (Blank rows = not tested)        TONE: increased  PROLAPSE: none  TODAY'S TREATMENT:   8/27/4 Manual: Soft tissue mobilization: Manual work to bilateral hip adductors, levator ani, obturator internist, ischiocavernosus, bulbocavernosus and educated patient on how to perform at home to reduce muscle tension Therapeutic activities: Functional strengthening activities: Educated patient on how to lay on her back at night to reduce the swelling of her abdomen with feet elevate or propped up on the wall and pillow under pelvis Educated patient on was to assist the abdominal swelling to be reduced with massage to assist the lymphatic fluid to drain.     08/17/22 Manual: Myofascial release: Fascial release of the urogenital diaphragm with left tighter than right.  Internal pelvic floor techniques: No  emotional/communication barriers or cognitive limitation. Patient is motivated to learn. Patient understands and agrees with  treatment goals and plan. PT explains patient will be examined in standing, sitting, and lying down to see how their muscles and joints work. When they are ready, they will be asked to remove their underwear so PT can examine their perineum. The patient is also given the option of providing their own chaperone as one is not provided in our facility. The patient also has the right and is explained the right to defer or refuse any part of the evaluation or treatment including the internal exam. With the patient's consent, PT will use one gloved finger to gently assess the muscles of the pelvic floor, seeing how well it contracts and relaxes and if there is muscle symmetry. After, the patient will get dressed and PT and patient will discuss exam findings and plan of care. PT and patient discuss plan of care, schedule, attendance policy and HEP activities.  Going through the vaginal canal working on the levator ani, perineal body, along the obturator internist and iliococcygeus while monitoring for pain level and going very slowly.  Fascial release along the sides of the urethra and bladder with left side having more pain than right  08/03/22 Manual: Soft tissue mobilization: Circular massage to abdomen  Manual work to the diaphragm with breath work Myofascial release: Fascial release around the lower abdomen and suprapubically going through the layers of fascia Supine pulling from the pubic bone to the naval to reduce the pressure on the pelvic floor and educated her on how to perform to reduce pressure Exercises: Stretches/mobility: Sitting piriformis stretch holding for 30 sec bil.  Sitting hamstring stretch holding for 30 sec bil.  Sitting happy baby holding for 30 sec Childs pose holding 30 sec with breathing into the back Laying on back with legs elevated to reduce pressure  on the pelvic floor Educated patient on how to perform perineal massage, hip adductor massage and gluteal to reduce trigger points in the pelvic floor Diaphragmatic breathing to elongate the diaphragm and pelvic floor                                                                                                                                PATIENT EDUCATION:  09/12/22 Education details: Access Code: [email or web address], perineal massage and pelvic meditation Person educated: Patient Education method: Explanation, Demonstration, Tactile cues, Verbal cues, and Handouts, you tube video Education comprehension: verbalized understanding, returned demonstration, verbal cues required, tactile cues required, and needs further education  HOME EXERCISE PROGRAM: 08/03/22 Access Code: [email or web address] URL: [email or web address] Date: 08/03/2022 Prepared by: Eulis Foster  Exercises - Supine Diaphragmatic Breathing  - 2 x daily - 7 x weekly - 1 sets - 10 reps - Seated Diaphragmatic Breathing  - 2 x daily - 7 x weekly - 1 sets - 10 reps - Seated Piriformis Stretch with Trunk Bend  - 1 x daily - 7 x weekly - 1 sets - 2 reps - 30 sec hold - Seated Hamstring Stretch  -  1 x daily - 7 x weekly - 1 sets - 2 reps - 30 sec hold - Seated Happy Baby With Trunk Flexion For Pelvic Relaxation  - 1 x daily - 7 x weekly - 1 sets - 1 reps - 30 sec hold - Child's Pose Stretch  - 1 x daily - 7 x weekly - 1 sets - 2 reps - 30 sec hold   ASSESSMENT:  CLINICAL IMPRESSION: Patient is a 47 y.o. female who was seen today for physical therapy  treatment for levator spasm.  Patient had an ultrasound of her pelvis showing multiple fibroid in her uterus. She will be consulting with her MD next week about the findings and the next step.   Patient functional scores have gone up due to the increased pain her fibroids are producing. She firmness below the umbilicus where her urterus is located. She has tenderness located throughout the  abdomen and pelvic floor. She was not able to have an internal assessment due to the increased pain. She had decreased pain after the manual work today and was educated on ways to reduce the abdominal swelling at night. Her abdomen increases double the size by the end of the day. Patient will benefit from skilled therapy to reduce her pain, improve pelvic floor coordination and improve quality of life.   OBJECTIVE IMPAIRMENTS: decreased activity tolerance, decreased coordination, decreased strength, increased fascial restrictions, increased muscle spasms, and pain.   ACTIVITY LIMITATIONS: carrying, lifting, standing, squatting, continence, toileting, and locomotion level  PARTICIPATION LIMITATIONS: meal prep, cleaning, laundry, driving, shopping, community activity, and occupation  PERSONAL FACTORS: 1 comorbidity: fibroids  are also affecting patient's functional outcome.   REHAB POTENTIAL: Excellent  CLINICAL DECISION MAKING: Evolving/moderate complexity  EVALUATION COMPLEXITY: Moderate   GOALS: Goals reviewed with patient? Yes  SHORT TERM GOALS: Target date: 08/23/22  Patient independent with initial HEP for meditation, hip stretches, diaphragmatic breathing to reduce pain.  Baseline:Not educated yet Goal status: Met 08/17/22   LONG TERM GOALS: Target date: 10/19/22  Patient independent with advanced HEP  for pain management, self massage to pelvic floor to reduce trigger points.  Baseline: Not educated yet Goal status: ongoing 09/12/22  2.  Patient is able to walk to the bathroom after the urge to urinate without leaking urine due to the ability to contract her pelvic floor stronger than >/= 3/5 and fully relax.  Baseline: Pelvic floor strength is 1/5 Goal status: ongoing 09/12/22  3.  Patient is able to wait 2-3 hours to urinate due to reduction of trigger points in the pelvic floor.  Baseline: urinates every hour Goal status: ongoing 09/12/22  4.  Patient is educated on bladder  irritants to reduce the urgency to urinate.  Baseline: not educated yet Goal status: ongoing 09/12/22  5.  PFIQ-7 score is </= 40 due to the reduction of pain and urinary leakage. Baseline: PFIQ-7 62 Goal status: ongoing 09/12/22   PLAN:  PT FREQUENCY: 1-2x/week  PT DURATION: 12 weeks  PLANNED INTERVENTIONS: Therapeutic exercises, Therapeutic activity, Neuromuscular re-education, Patient/Family education, Joint mobilization, Dry Needling, Electrical stimulation, Spinal mobilization, Cryotherapy, Taping, Ultrasound, Biofeedback, and Manual therapy  PLAN FOR NEXT SESSION: manual work to the abdomen,  release of the urogenital diaphragm, manual work to the pelvic floor, see what MD says  Eulis Foster, PT 09/12/22 10:32 AM

## 2022-09-21 ENCOUNTER — Encounter: Payer: Self-pay | Admitting: Obstetrics and Gynecology

## 2022-09-21 ENCOUNTER — Ambulatory Visit: Payer: Medicaid Other | Admitting: Obstetrics and Gynecology

## 2022-09-21 ENCOUNTER — Other Ambulatory Visit: Payer: Self-pay | Admitting: Obstetrics and Gynecology

## 2022-09-21 VITALS — BP 146/92 | HR 75 | Ht 64.08 in | Wt 132.4 lb

## 2022-09-21 DIAGNOSIS — D259 Leiomyoma of uterus, unspecified: Secondary | ICD-10-CM

## 2022-09-21 DIAGNOSIS — N920 Excessive and frequent menstruation with regular cycle: Secondary | ICD-10-CM

## 2022-09-21 DIAGNOSIS — D219 Benign neoplasm of connective and other soft tissue, unspecified: Secondary | ICD-10-CM

## 2022-09-21 MED ORDER — TRANEXAMIC ACID 650 MG PO TABS: 1300.0000 mg | ORAL_TABLET | Freq: Three times a day (TID) | ORAL | 8 refills | Status: DC

## 2022-09-21 NOTE — Progress Notes (Signed)
CC: fibroid discussion Subjective:    Patient ID: Catherine Estrada, female    DOB: 03-21-1975, 47 y.o.   MRN: [id number]  HPI Pt seen for ultrasound follow up and discussion of treatment options. Megace has been ineffective and pt has self discontinued.  She has restarted lysteda with heavy menses.    Ultrasound reviewed and 3 medium sized fibroids noted.  Normally would be candidate for Sonata, but pt does have a large subserosal fibroid which is impinging on the bladder.  As it extends toward the bladder and into the abdominal cavity, it will not be well treated due to heat dispersal.  Discussed alternative treatment options including GnRH antagonist, UFE or definitive surgical therapy.  Pt would like to avoid surgery if possible.   Review of Systems     Objective:   Physical Exam Vitals:   09/21/22 1126  BP: (!) 146/92  Pulse: 75   CLINICAL DATA:  Uterine fibroids, pelvic pain, menorrhagia   EXAM: TRANSABDOMINAL AND TRANSVAGINAL ULTRASOUND OF PELVIS   TECHNIQUE: Both transabdominal and transvaginal ultrasound examinations of the pelvis were performed. Transabdominal technique was performed for global imaging of the pelvis including uterus, ovaries, adnexal regions, and pelvic cul-de-sac. It was necessary to proceed with endovaginal exam following the transabdominal exam to visualize the endometrium and adnexal structures.   COMPARISON:  12/30/2020   FINDINGS: Uterus   Measurements: 14.4 x 7.3 x 11.9 cm = volume: 653.1 mL. There are numerous uterine fibroids identified. Exophytic fibroid off the left lateral aspect of the uterine body measures 3.6 x 4.1 x 5.0 cm. Intramural fibroid within the anterior aspect of the uterine fundus measures 4.6 x 3.6 x 4.1 cm, and an intramural fibroid within the posterior aspect of the uterine body measures 4.5 x 4.0 x 5.2 cm.   Endometrium   Thickness: 24 mm. Evaluation is limited due to distortion by the multiple uterine fibroids.    Right ovary   Measurements: 3.0 x 1.9 x 3.1 cm = volume: 9.2 mL. Normal appearance/no adnexal mass.   Left ovary   Not visualized.   Other findings   No abnormal free fluid.   IMPRESSION: 1. Fibroid uterus, with largest uterine fibroids outlined above. 2. Thickened endometrium measuring up to 24 mm. Visualization is limited due to multiple uterine fibroids. If bleeding remains unresponsive to hormonal or medical therapy, focal lesion work-up with sonohysterogram should be considered. Endometrial biopsy should also be considered in pre-menopausal patients at high risk for endometrial carcinoma. (Ref: Radiological Reasoning: Algorithmic Workup of Abnormal Vaginal Bleeding with Endovaginal Sonography and Sonohysterography. AJR 2008; 782:N56-21) 3. Unremarkable right ovary.  Nonvisualization of the left ovary.        Assessment & Plan:   1. Fibroids Refer to IR for eval for UFE.  Pt desires improvement of bleeding and mass effect on the bladder.  If UFE unable to be performed or is ineffective, next step would be hysterectomy for definitive therapy.  - CBC - Ambulatory referral to Interventional Radiology  2. Menorrhagia with regular cycle Pt felt some fatigue, last h/h was normal, but she feels she has bled more and would like a check.  CBC ordered. - CBC - Ambulatory referral to Interventional Radiology - tranexamic acid (LYSTEDA) 650 MG TABS tablet; Take 2 tablets (1,300 mg total) by mouth 3 (three) times daily. Take during menses for a maximum of five days  Dispense: 30 tablet; Refill: 8  I spent 30 minutes dedicated to the care of this patient including previsit  review of records, face to face time with the patient discussing ultrasound report, current treatment options and post visit testing.   Warden Fillers, MD Faculty Attending, [facility]

## 2022-09-21 NOTE — Progress Notes (Signed)
Pt presents for f/u. Pelvic US done 08-27-22. Pt wants to go back to L[place]. Pt reports heavy period, pain, bloating, fatigue, increased appetite and weight gain.

## 2022-09-22 ENCOUNTER — Ambulatory Visit
Admission: RE | Admit: 2022-09-22 | Discharge: 2022-09-22 | Disposition: A | Discharge status: Home / Self Care | Payer: Medicaid Other | Source: Ambulatory Visit | Attending: Obstetrics and Gynecology

## 2022-09-22 DIAGNOSIS — N920 Excessive and frequent menstruation with regular cycle: Secondary | ICD-10-CM

## 2022-09-22 DIAGNOSIS — D259 Leiomyoma of uterus, unspecified: Secondary | ICD-10-CM

## 2022-09-22 LAB — CBC
Hematocrit: 42.5 % (ref 34.0–46.6)
Hemoglobin: 13.3 g/dL (ref 11.1–15.9)
MCH: 28.7 pg (ref 26.6–33.0)
MCHC: 31.3 g/dL — ABNORMAL LOW (ref 31.5–35.7)
MCV: 92 fL (ref 79–97)
Platelets: 281 10*3/uL (ref 150–450)
RBC: 4.64 x10E6/uL (ref 3.77–5.28)
RDW: 12.8 % (ref 11.7–15.4)
WBC: 6.7 10*3/uL (ref 3.4–10.8)

## 2022-09-22 MED ORDER — GADOPICLENOL 0.5 MMOL/ML IV SOLN
6.0000 mL | Freq: Once | INTRAVENOUS | Status: AC | PRN
  Administered 2022-09-22: 6 mL via INTRAVENOUS

## 2022-10-02 ENCOUNTER — Ambulatory Visit
Admission: RE | Admit: 2022-10-02 | Discharge: 2022-10-02 | Disposition: A | Discharge status: Home / Self Care | Payer: Medicaid Other | Source: Ambulatory Visit | Attending: Obstetrics and Gynecology

## 2022-10-02 DIAGNOSIS — N920 Excessive and frequent menstruation with regular cycle: Secondary | ICD-10-CM

## 2022-10-02 DIAGNOSIS — D259 Leiomyoma of uterus, unspecified: Secondary | ICD-10-CM

## 2022-10-02 NOTE — Consult Note (Signed)
Chief Complaint: Patient was seen in consultation today for symptomotic uterine fibroids at the request of Bass,Lawrence A  Referring Physician(s): Bass,Lawrence A  History of Present Illness: Catherine Estrada is a 47 y.o. female with multiyear history of pelvic pain, dyspareunia, heavy uterine bleeding x24yr, urinary frequency x64months. Uterine fibroids documented on [place] studies back as far as 11/13/20217. No prior fibroid treatments. G2P2 no plans for future pregnancy. Not using birth control currently.  Past Medical History:  Diagnosis Date   Asthma 2014   Fibroid    Gastritis    History of bilateral breast implants    summer 2022   Migraine     Past Surgical History:  Procedure Laterality Date   VAGINOPLASTY  2012   had "vaginal tightening" after last delivery    Allergies: Patient has no known allergies.  Medications: Prior to Admission medications   Medication Sig Start Date End Date Taking? Authorizing Provider  albuterol (PROVENTIL HFA;VENTOLIN HFA) 108 (90 Base) MCG/ACT inhaler Inhale 2 puffs into the lungs every 6 (six) hours as needed for wheezing or shortness of breath.    [provider]  amitriptyline (ELAVIL) 25 MG tablet Take 25 mg by mouth daily. 03/16/22   [provider]  cyclobenzaprine (FLEXERIL) 5 MG tablet Take 1 tablet (5 mg total) by mouth 3 (three) times daily as needed for muscle spasms. 05/18/22   Selmer Dominion, NP  megestrol (MEGACE) 40 MG tablet Take 1 tablet (40 mg total) by mouth 2 (two) times daily. Can increase to two tablets twice a day in the event of heavy bleeding Patient not taking: Reported on 09/21/2022 08/15/22   Warden Fillers, MD  mirabegron ER (MYRBETRIQ) 50 MG TB24 tablet Take 1 tablet (50 mg total) by mouth daily. Patient not taking: Reported on 09/21/2022 06/29/22   Selmer Dominion, NP  Multiple Vitamin (MULTIVITAMIN) tablet Take 1 tablet by mouth daily.    [provider]  topiramate (TOPAMAX) 25  MG tablet Take 25 mg by mouth 2 (two) times daily. Patient not taking: Reported on 08/15/2022 04/12/22   [provider]  tranexamic acid (LYSTEDA) 650 MG TABS tablet Take 2 tablets (1,300 mg total) by mouth 3 (three) times daily. Take during menses for a maximum of five days 09/21/22   Warden Fillers, MD     Family History  Problem Relation Age of Onset   Hypertension Mother    Hypertension Father    Heart block Sister    Breast cancer Maternal Aunt    Breast cancer Paternal Aunt 73   Heart block Maternal Grandmother    Heart block Paternal Grandmother     Social History   Socioeconomic History   Marital status: Legally Separated    Spouse name: Not on file   Number of children: 2   Years of education: Not on file   Highest education level: Bachelor's degree (e.g., BA, AB, BS)  Occupational History   Not on file  Tobacco Use   Smoking status: Never   Smokeless tobacco: Never  Vaping Use   Vaping status: Never Used  Substance and Sexual Activity   Alcohol use: Yes    Comment: Rare   Drug use: No   Sexual activity: Yes    Partners: Male    Birth control/protection: Pill  Other Topics Concern   Not on file  Social History Narrative   Not on file   Social Determinants of Health   Financial Resource Strain: Not  on file  Food Insecurity: Not on file  Transportation Needs: No Transportation Needs (10/16/2019)   PRAPARE - Administrator, Civil Service (Medical): No    Lack of Transportation (Non-Medical): No  Physical Activity: Not on file  Stress: Not on file  Social Connections: Unknown (02/22/2022)   Received from [facility]   Social Network    Social Network: Not on file    ECOG Status: 1 - Symptomatic but completely ambulatory  Review of Systems  Review of Systems: A 12 point ROS discussed and pertinent positives are indicated in the HPI above.  All other systems are negative.  Physical Exam Vital Signs: BP 128/89    Pulse 90   Temp 98.2 F (36.8 C) (Oral)   Resp 20   LMP 09/17/2022   SpO2 99%  Constitutional: Oriented to person, place, and time. Well-developed and well-nourished. No distress.  HENT:  Head: Normocephalic and atraumatic.  Eyes: Conjunctivae and EOM are normal. Right eye exhibits no discharge. Left eye exhibits no discharge. No scleral icterus.  Neck: No JVD present.  Pulmonary/Chest: Effort normal. No stridor. No respiratory distress.  Abdomen: soft, non distended Neurological:  alert and oriented to person, place, and time.  Skin: Skin is warm and dry.  not diaphoretic.  Psychiatric:   normal mood and affect.   behavior is normal. Judgment and thought content normal.         Imaging: MR PELVIS W WO CONTRAST  Result Date: 10/02/2022 CLINICAL DATA:  Pelvic pain. Frequent urination. Fibroids. Treatment planning. EXAM: MRI PELVIS WITHOUT AND WITH CONTRAST TECHNIQUE: Multiplanar multisequence MR imaging of the pelvis was performed both before and after administration of intravenous contrast. CONTRAST:  6 mL Vueway COMPARISON:  None Available. FINDINGS: Lower Urinary Tract: No bladder or urethral abnormality identified. Bowel:  Unremarkable visualized pelvic bowel loops. Vascular/Lymphatic: No pathologically enlarged lymph nodes or other significant abnormality. Reproductive: -- Uterus: Measures 12.7 x 10.9 x 11.9 cm (volume = 863 cm^3). Diffuse uterine involvement by numerous fibroids is seen. These fibroids are submucosal, intramural, and subserosal in location, with largest of these fibroids measuring 4.7 cm in the left posterior corpus. -- Intracavitary fibroids: A 1.8 cm intracavitary fibroid is seen in the central uterine corpus (image 22/3 and image 16/6). -- Pedunculated fibroids: A pedunculated fibroid is seen arising from the left anterior corpus measuring 4.4 cm. Soft pedicle attachment the uterus measures 2.0 cm in thickness. -- Fibroid contrast enhancement: All fibroids show  contrast enhancement, without significant degeneration/devascularization. -- Right ovary:  Appears normal.  No mass identified. -- Left ovary:  Appears normal.  No mass identified. Other: No abnormal free fluid. Musculoskeletal:  Unremarkable. IMPRESSION: Diffuse uterine involvement by numerous fibroids, largest measuring 4.7 cm. 1.8 cm intracavitary fibroid in the central uterine corpus. 4.4 cm pedunculated fibroid arising from the left anterior corpus, with soft tissue pedicle measuring 2.0 cm in thickness. Normal appearance of both ovaries. No adnexal mass identified. Electronically Signed   By: Danae Orleans M.D.   On: 10/02/2022 09:29    Labs:  CBC: Recent Labs    02/20/22 1959 09/21/22 1344  WBC 12.3* 6.7  HGB 13.8 13.3  HCT 41.5 42.5  PLT 261 281    COAGS: No results for input(s): "INR", "APTT" in the last 8760 hours.  BMP: Recent Labs    02/20/22 1959  NA 136  K 3.8  CL 101  CO2 23  GLUCOSE 142*  BUN 10  CALCIUM 9.6  CREATININE 0.74  GFRNONAA >60    LIVER FUNCTION TESTS: Recent Labs    02/20/22 1959  BILITOT 0.4  AST 15  ALT 10  ALKPHOS 55  PROT 7.9  ALBUMIN 4.4    TUMOR MARKERS: No results for input(s): "AFPTM", "CEA", "CA199", "CHROMGRNA" in the last 8760 hours.  Assessment and Plan:  My impression is that this patient's menorrhagia and urinary frequency likely secondary to uterine fibroids. We spent the majority of the consultation discussing the pathophysiology of uterine leiomyomata, natural history, anticipated  involution post menopause, and treatment options. We discussed myomectomy, hysterectomy, and uterine fibroid embolization. I described the technique of UFE, anticipated benefits, possible risks and complications including but not limited to bleeding, infection, vessel damage, nontarget embolization, and incomplete symptom relief. We discussed the 90% clinical success rate historically and at our experience with UFE. We discussed the post  procedure course and time course of symptom resolution. We discussed the need for continued gynecologic care.  She seemed to understand and did ask appropriate questions, which were answered.  Based on her history , symptomatology, and recent MRI showing innumerable fibroids and uterine enlargement compressing the urinary bladder, she would be an appropriate candidate for uterine fibroid embolization because of her symptomatology and  uterine fibroids.  After our discussion, the patient was motivated proceed. Accordingly, we can   proceed with UFE, via femoral approach, with moderate sedation, at [facility].   Thank you for this interesting consult.  I greatly enjoyed meeting Catherine Estrada and look forward to participating in their care.  A copy of this report was sent to the requesting provider on this date.  Electronically Signed: Durwin Glaze 10/02/2022, 1:41 PM   I spent a total of 40 Minutes   in face to face in clinical consultation, greater than 50% of which was counseling/coordinating care for symptomatic uterine fibroids.

## 2022-10-11 ENCOUNTER — Other Ambulatory Visit (HOSPITAL_COMMUNITY): Payer: Self-pay | Admitting: Interventional Radiology

## 2022-10-11 DIAGNOSIS — D259 Leiomyoma of uterus, unspecified: Secondary | ICD-10-CM

## 2022-10-11 DIAGNOSIS — N920 Excessive and frequent menstruation with regular cycle: Secondary | ICD-10-CM

## 2022-10-12 ENCOUNTER — Encounter: Payer: Medicaid Other | Admitting: Physical Therapy

## 2022-10-19 ENCOUNTER — Encounter: Payer: Medicaid Other | Attending: Obstetrics and Gynecology | Admitting: Physical Therapy

## 2022-10-19 ENCOUNTER — Encounter: Payer: Self-pay | Admitting: Physical Therapy

## 2022-10-19 DIAGNOSIS — R252 Cramp and spasm: Secondary | ICD-10-CM

## 2022-10-19 DIAGNOSIS — R102 Pelvic and perineal pain unspecified side: Secondary | ICD-10-CM

## 2022-10-19 NOTE — Therapy (Addendum)
 OUTPATIENT PHYSICAL THERAPY FEMALE PELVIC TREATMENT   Patient Name: Catherine Estrada MRN: [id number] DOB:[birth year], 47 y.o., female Today's Date: 10/19/2022  END OF SESSION:  PT End of Session - 10/19/22 1310     Visit Number 5    Date for PT Re-Evaluation 10/19/22    Authorization Type [employer]    Authorization Time Period 10/12/2022-11/10/2022    Authorization - Visit Number 1    Authorization - Number of Visits 4    PT Start Time 1310    PT Stop Time 1350    PT Time Calculation (min) 40 min    Activity Tolerance Patient tolerated treatment well    Behavior During Therapy [facility] for tasks assessed/performed             Past Medical History:  Diagnosis Date   Asthma 2014   Fibroid    Gastritis    History of bilateral breast implants    summer 2022   Migraine    Past Surgical History:  Procedure Laterality Date   VAGINOPLASTY  2012   had vaginal tightening after last delivery   Patient Active Problem List   Diagnosis Date Noted   Encounter for counseling 12/13/2021   Vaginal discomfort 10/19/2021   Menorrhagia with regular cycle 06/15/2021   Screening breast examination 10/10/2018   Encounter to discuss test results 12/14/2016   Encounter for counseling regarding contraception 11/16/2016   Routine screening for STI (sexually transmitted infection) 11/16/2016   Fibroids 01/04/2016    PCP: Henry Ingle, MD  REFERRING PROVIDER: Zuleta, Kaitlin G, NP   REFERRING DIAG: [phone] (ICD-10-CM) - Levator spasm   THERAPY DIAG:  Cramp and spasm - Plan: PT plan of care cert/re-cert  Pelvic pain - Plan: PT plan of care cert/re-cert  Rationale for Evaluation and Treatment: Rehabilitation  ONSET DATE: 2016  SUBJECTIVE:                                                                                                                                                                                           SUBJECTIVE STATEMENT: I am going to have a procedure  done on 11/10/22 that is a fibroid embolization. My periods are very heavy and painful.    PAIN:  Are you having pain? Yes NPRS scale: 8/10, at night gets to a 10/10  Pain location: lower abdomen and has swelling   Pain type: sharp Pain description: intermittent   Aggravating factors: putting pressure on the abdomen, intercourse, during her cycle,  Relieving factors: hot pack, warm bath, medication  PRECAUTIONS: None  WEIGHT BEARING RESTRICTIONS: No  FALLS:  Has patient fallen in last 6  months? No  LIVING ENVIRONMENT: Lives with: lives with their family  OCCUPATION: interpreter  PLOF: Independent  PATIENT GOALS: reduce pain  PERTINENT HISTORY:  Fibroids; vaginoplasty 2012 Sexual abuse: No  BOWEL MOVEMENT: no issues Pain with bowel movement: No  URINATION: Pain with urination: No Fully empty bladder: Yes:   Stream: Strong Urgency: Yes: goes frequently Frequency: urinates every hour Leakage: Urge to void, Walking to the bathroom, Intercourse, and full bladder; 2 times per day Pads: Yes: 4 pantyliners  INTERCOURSE: Pain with intercourse: After Intercourse and deep penetration Ability to have vaginal penetration:  Yes:   Climax: yes Marinoff Scale: 2/3  PREGNANCY: Vaginal deliveries 2 Tearing Yes: not sure how big the tears was  PROLAPSE: None   OBJECTIVE:   DIAGNOSTIC FINDINGS:  Pelvic floor strength I/V, puborectalis    Pelvic floor musculature: Right levator non-tender, Right obturator tender, Left levator tender, Left obturator tender. Also tender at opening of introitus.   PVR of 0 ml was obtained by bladder scan.   PATIENT SURVEYS:  UIQ-7 18; POPIQ-7 43 PFIQ-7 62 09/12/22 UIQ-7 57; POPIQ-7 86 PFIQ-7 143 10/19/22 UIQ-7 33; POPIQ-7 33 PFIQ-7 33   COGNITION: Overall cognitive status: Within functional limits for tasks assessed     SENSATION: Light touch: Appears intact Proprioception: Appears intact   POSTURE: No Significant postural  limitations  PELVIC ALIGNMENT: ASIS are equal  LUMBARAROM/PROM: Lumbar ROM is full   LOWER EXTREMITY ROM:  Passive ROM Right eval Left eval Right/Left 10/19/22  Hip external rotation 40 40 50   (Blank rows = not tested)  LOWER EXTREMITY MMT:  MMT Right eval Left eval Right 09/12/22 Left  09/12/22 Right 10/19/22 Left  10/19/22  Hip extension 4/5 4/5 5/5 4/5 5/5 5/5  Hip abduction 4/5 4/5 4+/5 4/5 4/5 4/5   PALPATION:   General  will lift up her rib cage to contract the abdomen and not able to isolate them.  09/12/22 tenderness throughout the abdomen especially on the left lower quadrant.Firmness below the umbilicus and above the pubic bone where the uterus is located.                 External Perineal Exam no tenderness, redness in the posterior fourchette,  09/12/22 Tenderness located in the ischiocavernosus, bulbocavernosus, levator ani, and obturator internist                             Internal Pelvic Floor tenderness located on bilateral levator ani, obturator internist, along the bladder, along the urethra 09/12/22 no internal assessment due to the increased pain  Patient confirms identification and approves PT to assess internal pelvic floor and treatment Yes  PELVIC MMT:   MMT eval  Vaginal 1/5  (Blank rows = not tested) 10/19/22: pelvic floor assessment not done due to her being on her cycle and pain       TONE: increased  PROLAPSE: none  TODAY'S TREATMENT:   10/19/22 Manual: Kinesiotape Placed tape along the lower abdomen, from the pubic bone and up and along the lumbar spine for abdominal pain then educated on how to do at home giving her pieces to use at home.  Myofascial release: Fascial release to the lower abdomen gently going through the restrictions to reduce pain with fibroids and being on her cycle Moist heat was placed on lumbar during the treatment.     8/27/4 Manual: Soft tissue mobilization: Manual work to bilateral hip adductors,  levator ani,  obturator internist, ischiocavernosus, bulbocavernosus and educated patient on how to perform at home to reduce muscle tension Therapeutic activities: Functional strengthening activities: Educated patient on how to lay on her back at night to reduce the swelling of her abdomen with feet elevate or propped up on the wall and pillow under pelvis Educated patient on was to assist the abdominal swelling to be reduced with massage to assist the lymphatic fluid to drain.     08/17/22 Manual: Myofascial release: Fascial release of the urogenital diaphragm with left tighter than right.  Internal pelvic floor techniques: No emotional/communication barriers or cognitive limitation. Patient is motivated to learn. Patient understands and agrees with treatment goals and plan. PT explains patient will be examined in standing, sitting, and lying down to see how their muscles and joints work. When they are ready, they will be asked to remove their underwear so PT can examine their perineum. The patient is also given the option of providing their own chaperone as one is not provided in our facility. The patient also has the right and is explained the right to defer or refuse any part of the evaluation or treatment including the internal exam. With the patient's consent, PT will use one gloved finger to gently assess the muscles of the pelvic floor, seeing how well it contracts and relaxes and if there is muscle symmetry. After, the patient will get dressed and PT and patient will discuss exam findings and plan of care. PT and patient discuss plan of care, schedule, attendance policy and HEP activities.  Going through the vaginal canal working on the levator ani, perineal body, along the obturator internist and iliococcygeus while monitoring for pain level and going very slowly.  Fascial release along the sides of the urethra and bladder with left side having more pain than right  08/03/22 Manual: Soft  tissue mobilization: Circular massage to abdomen  Manual work to the diaphragm with breath work Myofascial release: Fascial release around the lower abdomen and suprapubically going through the layers of fascia Supine pulling from the pubic bone to the naval to reduce the pressure on the pelvic floor and educated her on how to perform to reduce pressure Exercises: Stretches/mobility: Sitting piriformis stretch holding for 30 sec bil.  Sitting hamstring stretch holding for 30 sec bil.  Sitting happy baby holding for 30 sec Childs pose holding 30 sec with breathing into the back Laying on back with legs elevated to reduce pressure on the pelvic floor Educated patient on how to perform perineal massage, hip adductor massage and gluteal to reduce trigger points in the pelvic floor Diaphragmatic breathing to elongate the diaphragm and pelvic floor                                                                                                                                PATIENT EDUCATION:  10/19/22 Education details: Access Code: [email or web address], perineal massage and pelvic meditation, how to place kinesio  tape on the abdomen and lumbar to reduce her pain Person educated: Patient Education method: Explanation, Demonstration, Tactile cues, Verbal cues, and Handouts, you tube video Education comprehension: verbalized understanding, returned demonstration, verbal cues required, tactile cues required, and needs further education  HOME EXERCISE PROGRAM: 08/03/22 Access Code: [email or web address] URL: [email or web address] Date: 08/03/2022 Prepared by: Channing Pereyra  Exercises - Supine Diaphragmatic Breathing  - 2 x daily - 7 x weekly - 1 sets - 10 reps - Seated Diaphragmatic Breathing  - 2 x daily - 7 x weekly - 1 sets - 10 reps - Seated Piriformis Stretch with Trunk Bend  - 1 x daily - 7 x weekly - 1 sets - 2 reps - 30 sec hold - Seated Hamstring Stretch  - 1 x daily - 7 x weekly - 1 sets - 2  reps - 30 sec hold - Seated Happy Baby With Trunk Flexion For Pelvic Relaxation  - 1 x daily - 7 x weekly - 1 sets - 1 reps - 30 sec hold - Child's Pose Stretch  - 1 x daily - 7 x weekly - 1 sets - 2 reps - 30 sec hold   ASSESSMENT:  CLINICAL IMPRESSION: Patient is a 47 y.o. female who was seen today for physical therapy  treatment for levator spasm.  Patient had an ultrasound of her pelvis showing multiple fibroid in her uterus.She has decided to do the embolization  for the fibroids and procedure will be done on 11/10/22. She is having significant pain at level 9-10/10 from the fibroids. She will have to urinate frequently due to the fibroid oin the urterus is laying on the bladder. Patient has pain with moving in bed, walking, sitting, lifting items at this time. Pelvic floor strength is 1/5. Therapist not able to do an internal muscle assessment due to her being on her cycle and the significant pain from the fibroids. She will be on hold until after the procedure and the doctor gives her clearance. Her PFIQ-7 has improved from 143 to 33 due to understanding what is causing her pain and has a plan to help it. Patient will benefit from skilled therapy to reduce her pain, improve pelvic floor coordination and improve quality of life.   OBJECTIVE IMPAIRMENTS: decreased activity tolerance, decreased coordination, decreased strength, increased fascial restrictions, increased muscle spasms, and pain.   ACTIVITY LIMITATIONS: carrying, lifting, standing, squatting, continence, toileting, and locomotion level  PARTICIPATION LIMITATIONS: meal prep, cleaning, laundry, driving, shopping, community activity, and occupation  PERSONAL FACTORS: 1 comorbidity: fibroids are also affecting patient's functional outcome.   REHAB POTENTIAL: Excellent  CLINICAL DECISION MAKING: Evolving/moderate complexity  EVALUATION COMPLEXITY: Moderate   GOALS: Goals reviewed with patient? Yes  SHORT TERM GOALS: Target  date: 08/23/22  Patient independent with initial HEP for meditation, hip stretches, diaphragmatic breathing to reduce pain.  Baseline:Not educated yet Goal status: Met 08/17/22   LONG TERM GOALS: Target date: 04/19/23  Patient independent with advanced HEP  for pain management, self massage to pelvic floor to reduce trigger points.  Baseline: Not educated yet Goal status: ongoing 09/12/22  2.  Patient is able to walk to the bathroom after the urge to urinate without leaking urine due to the ability to contract her pelvic floor stronger than >/= 3/5 and fully relax.  Baseline: Pelvic floor strength is 1/5 Goal status: ongoing 09/12/22  3.  Patient is able to wait 2-3 hours to urinate due to reduction of trigger points in the pelvic floor.  Baseline:  urinates every hour Goal status: ongoing 09/12/22  4.  Patient is educated on bladder irritants to reduce the urgency to urinate.  Baseline: not educated yet Goal status: ongoing 09/12/22  5.  PFIQ-7 score is </= 40 due to the reduction of pain and urinary leakage. Baseline: PFIQ-7 62 Goal status: ongoing 09/12/22   PLAN:  PT FREQUENCY: 1-2x/week  PT DURATION: 6 months  PLANNED INTERVENTIONS: Therapeutic exercises, Therapeutic activity, Neuromuscular re-education, Patient/Family education, Joint mobilization, Dry Needling, Electrical stimulation, Spinal mobilization, Cryotherapy, Taping, Ultrasound, Biofeedback, and Manual therapy  PLAN FOR NEXT SESSION: manual work to the abdomen,  release of the urogenital diaphragm, manual work to the pelvic floor, see what MD says ;reassess when she returns.   Channing Pereyra, PT 10/19/22 2:50 PM   PHYSICAL THERAPY DISCHARGE SUMMARY  Visits from Start of Care: 5  Current functional level related to goals / functional outcomes: See above. Patient did not return after her last visit on 10/19/22 due to pursuing other treatments for her Fibroids.    Remaining deficits: See above.    Education /  Equipment: HEP   Patient agrees to discharge. Patient goals were not met. Patient is being discharged due to not returning since the last visit. Thank you for the referral.   Channing Pereyra, PT 12/20/23 4:26 PM

## 2022-11-09 ENCOUNTER — Other Ambulatory Visit: Payer: Self-pay | Admitting: Radiology

## 2022-11-09 DIAGNOSIS — D259 Leiomyoma of uterus, unspecified: Secondary | ICD-10-CM

## 2022-11-09 NOTE — H&P (Signed)
Referring Physician(s): Bass,L  Supervising Physician: Oley Balm  Patient Status:  [facility] OP TBA  Chief Complaint:  Symptomatic uterine fibroids  Subjective: Patient known to IR team from recent consultation with Dr. Deanne Coffer on 10/02/22 to discuss treatment options for symptomatic uterine fibroids.  She is a 47 year old female with past medical history significant for asthma, gastritis, migraine headaches with multiyear history of pelvic pain/dyspareunia/heavy uterine bleeding x 1 year, urinary frequency x 6 months.  She has a history of uterine fibroids dating back as far as 2017 but no fibroid treatments in the past.  Following discussion with Dr. Deanne Coffer patient was deemed an appropriate candidate for bilateral uterine artery embolization and presents today for the procedure.  She currently denies fever, chest pain, dyspnea, cough, back pain, nausea, vomiting.  She does have history of migraine headaches, she has some mild abdominal discomfort/pressure; she is currently on her period, started this past Saturday.    Past Medical History:  Diagnosis Date   Asthma 2014   Fibroid    Gastritis    History of bilateral breast implants    summer 2022   Migraine    Past Surgical History:  Procedure Laterality Date   VAGINOPLASTY  2012   had "vaginal tightening" after last delivery    Allergies: Patient has no known allergies.  Medications: Prior to Admission medications   Medication Sig Start Date End Date Taking? Authorizing Provider  albuterol (PROVENTIL HFA;VENTOLIN HFA) 108 (90 Base) MCG/ACT inhaler Inhale 2 puffs into the lungs every 6 (six) hours as needed for wheezing or shortness of breath.    [provider]  amitriptyline (ELAVIL) 25 MG tablet Take 25 mg by mouth daily. 03/16/22   [provider]  cyclobenzaprine (FLEXERIL) 5 MG tablet Take 1 tablet (5 mg total) by mouth 3 (three) times daily as needed for muscle spasms. 05/18/22   Selmer Dominion,  NP  megestrol (MEGACE) 40 MG tablet Take 1 tablet (40 mg total) by mouth 2 (two) times daily. Can increase to two tablets twice a day in the event of heavy bleeding Patient not taking: Reported on 09/21/2022 08/15/22   Warden Fillers, MD  mirabegron ER (MYRBETRIQ) 50 MG TB24 tablet Take 1 tablet (50 mg total) by mouth daily. Patient not taking: Reported on 09/21/2022 06/29/22   Selmer Dominion, NP  Multiple Vitamin (MULTIVITAMIN) tablet Take 1 tablet by mouth daily.    [provider]  topiramate (TOPAMAX) 25 MG tablet Take 25 mg by mouth 2 (two) times daily. Patient not taking: Reported on 08/15/2022 04/12/22   [provider]  tranexamic acid (LYSTEDA) 650 MG TABS tablet Take 2 tablets (1,300 mg total) by mouth 3 (three) times daily. Take during menses for a maximum of five days 09/21/22   Warden Fillers, MD     Vital Signs:pending    Code Status:FULL CODE  Physical Exam: Awake, alert.  Chest clear to auscultation bilaterally.  Heart with slightly tachycardic but regular rhythm.  Abdomen soft, positive bowel sounds, some mild lower abdominal tenderness to palpation.  No lower extremity edema, intact distal pulses.  Imaging: No results found.  Labs:  CBC: Recent Labs    02/20/22 1959 09/21/22 1344  WBC 12.3* 6.7  HGB 13.8 13.3  HCT 41.5 42.5  PLT 261 281    COAGS: No results for input(s): "INR", "APTT" in the last 8760 hours.  BMP: Recent Labs    02/20/22 1959  NA 136  K 3.8  CL 101  CO2 23  GLUCOSE 142*  BUN 10  CALCIUM 9.6  CREATININE 0.74  GFRNONAA >60    LIVER FUNCTION TESTS: Recent Labs    02/20/22 1959  BILITOT 0.4  AST 15  ALT 10  ALKPHOS 55  PROT 7.9  ALBUMIN 4.4    Assessment and Plan: 47 year old female with past medical history significant for asthma, gastritis, migraine headaches with multiyear history of pelvic pain/dyspareunia/heavy uterine bleeding x 1 year, urinary frequency x 6 months.  She has a history of uterine  fibroids dating back as far as 2017 but no fibroid treatments in the past.  Following recent  consultation with Dr. Deanne Coffer patient was deemed an appropriate candidate for bilateral uterine artery embolization and presents today for the procedure.Risks and benefits of procedure were discussed with the patient including, but not limited to bleeding, infection, vascular injury or contrast induced renal failure.  This interventional procedure involves the use of X-rays and because of the nature of the planned procedure, it is possible that we will have prolonged use of X-ray fluoroscopy.  Potential radiation risks to you include (but are not limited to) the following: - A slightly elevated risk for cancer  several years later in life. This risk is typically less than 0.5% percent. This risk is low in comparison to the normal incidence of human cancer, which is 33% for women and 50% for men according to the [employer] Society. - Radiation induced injury can include skin redness, resembling a rash, tissue breakdown / ulcers and hair loss (which can be temporary or permanent).   The likelihood of either of these occurring depends on the difficulty of the procedure and whether you are sensitive to radiation due to previous procedures, disease, or genetic conditions.   IF your procedure requires a prolonged use of radiation, you will be notified and given written instructions for further action.  It is your responsibility to monitor the irradiated area for the 2 weeks following the procedure and to notify your physician if you are concerned that you have suffered a radiation induced injury.    All of the patient's questions were answered, patient is agreeable to proceed.  Consent signed and in chart.  Postprocedure patient will be observed in the hospital overnight for pain control.      Electronically Signed: D. Jeananne Rama, PA-C 11/09/2022, 12:20 PM   I spent a total of 30 minutes at the  the patient's bedside AND on the patient's hospital floor or unit, greater than 50% of which was counseling/coordinating care for bilateral uterine artery embolization

## 2022-11-10 ENCOUNTER — Encounter (HOSPITAL_COMMUNITY): Payer: Self-pay

## 2022-11-10 ENCOUNTER — Other Ambulatory Visit: Payer: Self-pay

## 2022-11-10 ENCOUNTER — Other Ambulatory Visit (HOSPITAL_COMMUNITY): Payer: Self-pay | Admitting: Interventional Radiology

## 2022-11-10 ENCOUNTER — Ambulatory Visit (HOSPITAL_COMMUNITY)
Admission: RE | Admit: 2022-11-10 | Discharge: 2022-11-10 | Disposition: A | Discharge status: Home / Self Care | Payer: Medicaid Other | Source: Ambulatory Visit | Attending: Interventional Radiology | Admitting: Interventional Radiology

## 2022-11-10 ENCOUNTER — Ambulatory Visit (HOSPITAL_COMMUNITY)
Admission: RE | Admit: 2022-11-10 | Discharge: 2022-11-11 | Disposition: A | Discharge status: Home / Self Care | Payer: Medicaid Other | Source: Ambulatory Visit | Attending: Interventional Radiology | Admitting: Interventional Radiology

## 2022-11-10 VITALS — BP 105/65 | HR 90 | Temp 98.8°F | Resp 18 | Ht 64.08 in | Wt 127.0 lb

## 2022-11-10 DIAGNOSIS — D251 Intramural leiomyoma of uterus: Secondary | ICD-10-CM | POA: Insufficient documentation

## 2022-11-10 DIAGNOSIS — N920 Excessive and frequent menstruation with regular cycle: Secondary | ICD-10-CM | POA: Diagnosis not present

## 2022-11-10 DIAGNOSIS — D259 Leiomyoma of uterus, unspecified: Secondary | ICD-10-CM

## 2022-11-10 DIAGNOSIS — Z9882 Breast implant status: Secondary | ICD-10-CM | POA: Insufficient documentation

## 2022-11-10 HISTORY — PX: IR EMBO TUMOR ORGAN ISCHEMIA INFARCT INC GUIDE ROADMAPPING: IMG5449

## 2022-11-10 HISTORY — PX: IR ANGIOGRAM VISCERAL SELECTIVE: IMG657

## 2022-11-10 HISTORY — PX: IR ANGIOGRAM PELVIS SELECTIVE OR SUPRASELECTIVE: IMG661

## 2022-11-10 HISTORY — PX: IR US GUIDE VASC ACCESS RIGHT: IMG2390

## 2022-11-10 HISTORY — PX: IR ANGIOGRAM SELECTIVE EACH ADDITIONAL VESSEL: IMG667

## 2022-11-10 LAB — CBC WITH DIFFERENTIAL/PLATELET
Abs Immature Granulocytes: 0.01 10*3/uL (ref 0.00–0.07)
Basophils Absolute: 0.1 10*3/uL (ref 0.0–0.1)
Basophils Relative: 2 %
Eosinophils Absolute: 0.1 10*3/uL (ref 0.0–0.5)
Eosinophils Relative: 2 %
HCT: 33.7 % — ABNORMAL LOW (ref 36.0–46.0)
Hemoglobin: 11 g/dL — ABNORMAL LOW (ref 12.0–15.0)
Immature Granulocytes: 0 %
Lymphocytes Relative: 41 %
Lymphs Abs: 1.9 10*3/uL (ref 0.7–4.0)
MCH: 29.3 pg (ref 26.0–34.0)
MCHC: 32.6 g/dL (ref 30.0–36.0)
MCV: 89.9 fL (ref 80.0–100.0)
Monocytes Absolute: 0.4 10*3/uL (ref 0.1–1.0)
Monocytes Relative: 9 %
Neutro Abs: 2.2 10*3/uL (ref 1.7–7.7)
Neutrophils Relative %: 46 %
Platelets: 241 10*3/uL (ref 150–400)
RBC: 3.75 MIL/uL — ABNORMAL LOW (ref 3.87–5.11)
RDW: 12.6 % (ref 11.5–15.5)
WBC: 4.8 10*3/uL (ref 4.0–10.5)
nRBC: 0 % (ref 0.0–0.2)

## 2022-11-10 LAB — BASIC METABOLIC PANEL
Anion gap: 8 (ref 5–15)
BUN: 14 mg/dL (ref 6–20)
CO2: 26 mmol/L (ref 22–32)
Calcium: 9.2 mg/dL (ref 8.9–10.3)
Chloride: 104 mmol/L (ref 98–111)
Creatinine, Ser: 0.68 mg/dL (ref 0.44–1.00)
GFR, Estimated: >60 mL/min (ref >60)
Glucose, Bld: 96 mg/dL (ref 70–99)
Potassium: 3.9 mmol/L (ref 3.5–5.1)
Sodium: 138 mmol/L (ref 135–145)

## 2022-11-10 LAB — PROTIME-INR
INR: 1 (ref 0.8–1.2)
Prothrombin Time: 13.5 s (ref 11.4–15.2)

## 2022-11-10 LAB — HCG, SERUM, QUALITATIVE: Preg, Serum: NEGATIVE

## 2022-11-10 MED ORDER — ONDANSETRON HCL 4 MG/2ML IJ SOLN
INTRAMUSCULAR | Status: AC
Start: 2022-11-10 — End: ?
  Filled 2022-11-10: qty 2

## 2022-11-10 MED ORDER — ALBUTEROL SULFATE (2.5 MG/3ML) 0.083% IN NEBU: 3.0000 mL | INHALATION_SOLUTION | Freq: Four times a day (QID) | RESPIRATORY_TRACT | Status: DC | PRN

## 2022-11-10 MED ORDER — IOHEXOL 300 MG/ML  SOLN
100.0000 mL | Freq: Once | INTRAMUSCULAR | Status: AC | PRN
  Administered 2022-11-10: 50 mL via INTRA_ARTERIAL

## 2022-11-10 MED ORDER — LIDOCAINE HCL 1 % IJ SOLN
INTRAMUSCULAR | Status: AC
  Filled 2022-11-10: qty 20

## 2022-11-10 MED ORDER — SODIUM CHLORIDE 0.9% FLUSH: 9.0000 mL | INTRAVENOUS | Status: DC | PRN

## 2022-11-10 MED ORDER — MIDAZOLAM HCL 2 MG/2ML IJ SOLN
INTRAMUSCULAR | Status: AC | PRN
  Administered 2022-11-10 (×4): 1 mg via INTRAVENOUS

## 2022-11-10 MED ORDER — LIDOCAINE HCL 1 % IJ SOLN
20.0000 mL | Freq: Once | INTRAMUSCULAR | Status: AC
  Administered 2022-11-10: 5 mL via INTRADERMAL

## 2022-11-10 MED ORDER — HYDROMORPHONE HCL 1 MG/ML IJ SOLN
INTRAMUSCULAR | Status: AC
  Filled 2022-11-10: qty 1

## 2022-11-10 MED ORDER — NALOXONE HCL 0.4 MG/ML IJ SOLN: 0.4000 mg | INTRAMUSCULAR | Status: DC | PRN

## 2022-11-10 MED ORDER — IOHEXOL 300 MG/ML  SOLN
50.0000 mL | Freq: Once | INTRAMUSCULAR | Status: AC | PRN
  Administered 2022-11-10: 20 mL

## 2022-11-10 MED ORDER — HYDROMORPHONE 1 MG/ML IV SOLN
INTRAVENOUS | Status: DC
  Administered 2022-11-10: 0.3 mg via INTRAVENOUS
  Administered 2022-11-10: 0.6 mg via INTRAVENOUS
  Administered 2022-11-10: 2.7 mg via INTRAVENOUS
  Administered 2022-11-10: 30 mg via INTRAVENOUS
  Filled 2022-11-10 (×12): qty 30

## 2022-11-10 MED ORDER — HYDROCODONE-ACETAMINOPHEN 5-325 MG PO TABS: 1.0000 | ORAL_TABLET | ORAL | Status: DC | PRN

## 2022-11-10 MED ORDER — OXYCODONE-ACETAMINOPHEN 5-325 MG PO TABS
1.0000 | ORAL_TABLET | ORAL | Status: DC | PRN
  Administered 2022-11-10 – 2022-11-11 (×4): 1 via ORAL
  Filled 2022-11-10 (×4): qty 1

## 2022-11-10 MED ORDER — DIPHENHYDRAMINE HCL 50 MG/ML IJ SOLN: 12.5000 mg | Freq: Four times a day (QID) | INTRAMUSCULAR | Status: DC | PRN

## 2022-11-10 MED ORDER — DIPHENHYDRAMINE HCL 12.5 MG/5ML PO ELIX: 12.5000 mg | ORAL_SOLUTION | Freq: Four times a day (QID) | ORAL | Status: DC | PRN

## 2022-11-10 MED ORDER — FENTANYL CITRATE (PF) 100 MCG/2ML IJ SOLN
INTRAMUSCULAR | Status: AC | PRN
Start: 2022-11-10 — End: 2022-11-10
  Administered 2022-11-10 (×2): 50 ug via INTRAVENOUS

## 2022-11-10 MED ORDER — PROMETHAZINE HCL 25 MG/ML IJ SOLN
25.0000 mg | Freq: Once | INTRAMUSCULAR | Status: AC
  Administered 2022-11-10: 25 mg via INTRAMUSCULAR
  Filled 2022-11-10: qty 1

## 2022-11-10 MED ORDER — KETOROLAC TROMETHAMINE 30 MG/ML IJ SOLN
30.0000 mg | INTRAMUSCULAR | Status: AC
  Administered 2022-11-10: 30 mg via INTRAVENOUS
  Filled 2022-11-10: qty 1

## 2022-11-10 MED ORDER — ONDANSETRON HCL 4 MG/2ML IJ SOLN
4.0000 mg | Freq: Four times a day (QID) | INTRAMUSCULAR | Status: DC | PRN
  Administered 2022-11-10 – 2022-11-11 (×4): 4 mg via INTRAVENOUS
  Filled 2022-11-10 (×4): qty 2

## 2022-11-10 MED ORDER — FENTANYL CITRATE (PF) 100 MCG/2ML IJ SOLN
INTRAMUSCULAR | Status: AC
  Filled 2022-11-10: qty 4

## 2022-11-10 MED ORDER — CEFAZOLIN SODIUM-DEXTROSE 2-4 GM/100ML-% IV SOLN
2.0000 g | INTRAVENOUS | Status: AC
  Administered 2022-11-10: 2 g via INTRAVENOUS
  Filled 2022-11-10: qty 100

## 2022-11-10 MED ORDER — IOHEXOL 300 MG/ML  SOLN
50.0000 mL | Freq: Once | INTRAMUSCULAR | Status: AC | PRN
  Administered 2022-11-10: 25 mL via INTRA_ARTERIAL

## 2022-11-10 MED ORDER — SODIUM CHLORIDE 0.9 % IV SOLN: INTRAVENOUS | Status: DC

## 2022-11-10 MED ORDER — MIDAZOLAM HCL 2 MG/2ML IJ SOLN
INTRAMUSCULAR | Status: AC
  Filled 2022-11-10: qty 6

## 2022-11-10 MED ORDER — ASPIRIN-ACETAMINOPHEN-CAFFEINE 250-250-65 MG PO TABS
2.0000 | ORAL_TABLET | Freq: Four times a day (QID) | ORAL | Status: DC | PRN
  Administered 2022-11-10: 2 via ORAL
  Filled 2022-11-10 (×2): qty 2

## 2022-11-10 MED ORDER — ACETAMINOPHEN-CAFFEINE 500-65 MG PO TABS: 2.0000 | ORAL_TABLET | Freq: Four times a day (QID) | ORAL | Status: DC | PRN

## 2022-11-10 NOTE — Procedures (Signed)
  Procedure:  Left uterine artery embolization absent R UA, anatomic variant Preprocedure diagnosis: Diagnoses of Uterine leiomyoma, unspecified location and Menorrhagia with regular cycle were pertinent to this visit. Postprocedure diagnosis: same EBL:    minimal Complications:   none immediate  See full dictation in [employer].  Thora Lance MD Main # [phone] Pager  [phone] Mobile [phone]

## 2022-11-10 NOTE — Progress Notes (Signed)
Patient ID: Catherine Estrada, female   DOB: 1975/11/10, 47 y.o.   MRN: [id number] Pt s/p left UAE(absent right UA) earlier today; VSS; afebrile; groggy from dilaudid PCA, sister in room; pt c/o expected pelvic cramping, occ nausea; puncture site rt CFA soft,not sig tender, no discrete hematoma, intact distal pulses; for overnight obs, antiemetics prn; advance diet as tolerated; f/u with Dr. Deanne Coffer in 1 month IR clinic

## 2022-11-11 DIAGNOSIS — D259 Leiomyoma of uterus, unspecified: Secondary | ICD-10-CM | POA: Diagnosis not present

## 2022-11-11 MED ORDER — DOCUSATE SODIUM 100 MG PO CAPS: 100.0000 mg | ORAL_CAPSULE | Freq: Every day | ORAL | 0 refills | Status: AC

## 2022-11-11 MED ORDER — ONDANSETRON 4 MG PO TBDP: 4.0000 mg | ORAL_TABLET | Freq: Three times a day (TID) | ORAL | 0 refills | Status: DC | PRN

## 2022-11-11 MED ORDER — NAPROXEN 500 MG PO TABS: 500.0000 mg | ORAL_TABLET | Freq: Two times a day (BID) | ORAL | 0 refills | Status: AC

## 2022-11-11 MED ORDER — NAPROXEN 250 MG PO TABS
500.0000 mg | ORAL_TABLET | Freq: Once | ORAL | Status: AC
  Administered 2022-11-11: 500 mg via ORAL
  Filled 2022-11-11: qty 2

## 2022-11-11 MED ORDER — OXYCODONE-ACETAMINOPHEN 5-325 MG PO TABS: 1.0000 | ORAL_TABLET | Freq: Three times a day (TID) | ORAL | 0 refills | Status: AC | PRN

## 2022-11-11 NOTE — Plan of Care (Signed)
  Problem: Education: Goal: Understanding of CV disease, CV risk reduction, and recovery process will improve Outcome: Progressing   Problem: Activity: Goal: Ability to return to baseline activity level will improve Outcome: Progressing   

## 2022-11-11 NOTE — Discharge Summary (Signed)
Patient ID: Catherine Estrada MRN: [id number] DOB/AGE: [birth year] 47 y.o.  Admit date: 11/10/2022 Discharge date: 11/11/2022  Supervising Physician: Oley Balm  Patient Status: [facility] - In-pt  Admission Diagnoses: Symptomatic uterine fibroids  Discharge Diagnoses:  Symptomatic uterine fibroids  Discharged Condition: good  Hospital Course:   Catherine Estrada is a 47 year old female with history of symptomatic uterine fibroids who presented to [facility] Radiology 10/25 for uterine artery embolization.  Patient s/p left uterine artery embolization (R UA found to be absent, a known anatomic variant).  She was admitted post-procedure for observation and pain management.  She was given a PCA pump, however has successfully weaned to oral pain medication with tolerance.  She is requiring PRN zofran due to nausea but has eaten breakfast without vomiting.  She has had foley removed, and has been able to ambulate to the bathroom.  Received post-procedure care with patient and her sister who verbalize understanding.  She will be sent home with 7 days of Naproxen 500mg  BID, prn Percocet 5/325 #30 to be taken for severe pain, as well as zofran 4mg  ODT prn.  All medications were sent to her preferred pharmacy- [facility] on [address].  Site care instructions given.   She is stable for discharge home.  Order in place.   She is aware follow-up will be arranged with our schedulers over the coming weeks.    Discharge Exam: Blood pressure (!) 95/58, pulse 81, temperature 97.9 F (36.6 C), temperature source Oral, resp. rate 16, height 5' 4.08" (1.628 m), weight 127 lb (57.6 kg), last menstrual period 11/04/2022, SpO2 99%. General appearance: alert, cooperative, and no distress Resp: clear to auscultation bilaterally Cardio: regular rate and rhythm, S1, S2 normal, no murmur, click, rub or gallop GI: soft, non-tender; bowel sounds normal; no masses,  no organomegaly Skin: Skin color, texture, turgor normal.  No rashes or lesions Incision/Wound: procedure site intact, clean, and dry.  Mildly tender as expected without evidence of hematoma or pseudoaneurysm.   Disposition: Discharge disposition: 01-Home or Self Care       Discharge Instructions     Diet - low sodium heart healthy   Complete by: As directed    Discharge instructions   Complete by: As directed    Pain medication has been called in for you.  Take Naproxen as prescribed until prescription is complete. Take Tylenol 500mg  , 1-2 tablets as needed every 8 hrs for mild-moderate pain, percocet 5/325 mg 1-2 tablets every 6-8 hrs as needed for severe pain.  Do not drive if taking narcotic pain medication.  Take zofran as needed for nausea.  May shower tomorrow.  Keep procedure site clean and dry.  May replace bandaid as needed until healed/well-scabbed.  May take 100mg  colace daily as needed for constipation.  Schedulers will contact you with date and time of follow-up.   Increase activity slowly   Complete by: As directed    No dressing needed   Complete by: As directed       Allergies as of 11/11/2022   No Known Allergies      Medication List     TAKE these medications    albuterol 108 (90 Base) MCG/ACT inhaler Commonly known as: VENTOLIN HFA Inhale 2 puffs into the lungs every 6 (six) hours as needed for wheezing or shortness of breath.   ascorbic acid 500 MG tablet Commonly known as: VITAMIN C Take 500 mg by mouth daily.   aspirin-acetaminophen-caffeine 250-250-65 MG tablet Commonly known as:  EXCEDRIN MIGRAINE Take 1 tablet by mouth every 6 (six) hours as needed for headache.   cyclobenzaprine 5 MG tablet Commonly known as: FLEXERIL Take 1 tablet (5 mg total) by mouth 3 (three) times daily as needed for muscle spasms.   docusate sodium 100 MG capsule Commonly known as: Colace Take 1 capsule (100 mg total) by mouth daily for 10 days.   megestrol 40 MG tablet Commonly known as: MEGACE Take 1 tablet (40 mg  total) by mouth 2 (two) times daily. Can increase to two tablets twice a day in the event of heavy bleeding   mirabegron ER 50 MG Tb24 tablet Commonly known as: MYRBETRIQ Take 1 tablet (50 mg total) by mouth daily.   multivitamin tablet Take 1 tablet by mouth daily.   naproxen 500 MG tablet Commonly known as: NAPROSYN Take 1 tablet (500 mg total) by mouth 2 (two) times daily with a meal for 7 days.   ondansetron 4 MG disintegrating tablet Commonly known as: ZOFRAN-ODT Take 1 tablet (4 mg total) by mouth every 8 (eight) hours as needed for nausea or vomiting.   oxyCODONE-acetaminophen 5-325 MG tablet Commonly known as: Percocet Take 1-2 tablets by mouth every 8 (eight) hours as needed for up to 5 days for severe pain (pain score 7-10).   tranexamic acid 650 MG Tabs tablet Commonly known as: LYSTEDA Take 2 tablets (1,300 mg total) by mouth 3 (three) times daily. Take during menses for a maximum of five days   vitamin B-12 100 MCG tablet Commonly known as: CYANOCOBALAMIN Take 100 mcg by mouth daily.   VITAMIN D PO Take 1 tablet by mouth daily.               Discharge Care Instructions  (From admission, onward)           Start     Ordered   11/11/22 0000  No dressing needed        11/11/22 5284            Follow-up Information     [facility] Follow up.   Specialty: Radiology Why: Schedulers will contact you for follow scheduling. Contact information: [address] [place] [address] [phone]                 Electronically Signed: Hoyt Koch, PA 11/11/2022, 10:50 AM   I have spent Greater Than 30 Minutes discharging Catherine Estrada.

## 2022-11-11 NOTE — Progress Notes (Signed)
Patient was provided her discharge instructions and patient verbalized understanding. Patient family also at bedside and had no further questions. IV removed out of patient's arm. Patient successfully discharged from unit.

## 2022-11-11 NOTE — Progress Notes (Signed)
Foley removed Nausea well controlled at this time. Pain managed with PO percocet.   Ambulated in the hall for >57ft. Right groin site remains soft and intact, with pedal pulses 2+ bilaterally.   Crackers and ginger ale given to patient, tolerated well.

## 2022-11-13 ENCOUNTER — Encounter (HOSPITAL_COMMUNITY): Payer: Self-pay | Admitting: Radiology

## 2022-12-08 ENCOUNTER — Ambulatory Visit
Admission: RE | Admit: 2022-12-08 | Discharge: 2022-12-08 | Disposition: A | Discharge status: Home / Self Care | Payer: Medicaid Other | Source: Ambulatory Visit | Attending: Student

## 2022-12-08 DIAGNOSIS — D251 Intramural leiomyoma of uterus: Secondary | ICD-10-CM

## 2023-02-12 ENCOUNTER — Other Ambulatory Visit: Payer: Self-pay | Admitting: Obstetrics and Gynecology

## 2023-02-12 DIAGNOSIS — Z1231 Encounter for screening mammogram for malignant neoplasm of breast: Secondary | ICD-10-CM

## 2023-02-13 ENCOUNTER — Ambulatory Visit
Admission: RE | Admit: 2023-02-13 | Discharge: 2023-02-13 | Disposition: A | Discharge status: Home / Self Care | Payer: Medicaid Other | Source: Ambulatory Visit

## 2023-02-13 ENCOUNTER — Other Ambulatory Visit: Payer: Self-pay | Admitting: Obstetrics and Gynecology

## 2023-02-13 DIAGNOSIS — N6310 Unspecified lump in the right breast, unspecified quadrant: Secondary | ICD-10-CM

## 2023-02-13 DIAGNOSIS — Z1231 Encounter for screening mammogram for malignant neoplasm of breast: Secondary | ICD-10-CM

## 2023-02-13 DIAGNOSIS — N644 Mastodynia: Secondary | ICD-10-CM

## 2023-02-22 ENCOUNTER — Ambulatory Visit
Admission: RE | Admit: 2023-02-22 | Discharge: 2023-02-22 | Disposition: A | Discharge status: Home / Self Care | Payer: Medicaid Other | Source: Ambulatory Visit | Attending: Obstetrics and Gynecology | Admitting: Obstetrics and Gynecology

## 2023-02-22 DIAGNOSIS — N644 Mastodynia: Secondary | ICD-10-CM

## 2023-02-22 DIAGNOSIS — N6310 Unspecified lump in the right breast, unspecified quadrant: Secondary | ICD-10-CM

## 2023-02-23 LAB — AMB RESULTS CONSOLE CBG: Glucose: 139

## 2023-02-23 NOTE — Progress Notes (Signed)
 Patient attended Heart Event. Referral made for [facility] and PCP appointment

## 2023-02-26 ENCOUNTER — Telehealth: Payer: Self-pay | Admitting: Critical Care Medicine

## 2023-02-26 NOTE — Telephone Encounter (Signed)
 Spoke to patient to confirm appt, she will go to ED if worse and will take oral iron

## 2023-03-02 ENCOUNTER — Emergency Department (HOSPITAL_BASED_OUTPATIENT_CLINIC_OR_DEPARTMENT_OTHER)
Admission: EM | Admit: 2023-03-02 | Discharge: 2023-03-02 | Disposition: A | Discharge status: Home / Self Care | Payer: Medicaid Other | Attending: Emergency Medicine | Admitting: Emergency Medicine

## 2023-03-02 ENCOUNTER — Other Ambulatory Visit: Payer: Self-pay

## 2023-03-02 ENCOUNTER — Encounter (HOSPITAL_BASED_OUTPATIENT_CLINIC_OR_DEPARTMENT_OTHER): Payer: Self-pay

## 2023-03-02 ENCOUNTER — Emergency Department (HOSPITAL_BASED_OUTPATIENT_CLINIC_OR_DEPARTMENT_OTHER): Payer: Medicaid Other

## 2023-03-02 DIAGNOSIS — R002 Palpitations: Secondary | ICD-10-CM | POA: Diagnosis not present

## 2023-03-02 DIAGNOSIS — R519 Headache, unspecified: Secondary | ICD-10-CM | POA: Diagnosis present

## 2023-03-02 DIAGNOSIS — R531 Weakness: Secondary | ICD-10-CM | POA: Diagnosis not present

## 2023-03-02 DIAGNOSIS — R42 Dizziness and giddiness: Secondary | ICD-10-CM | POA: Diagnosis not present

## 2023-03-02 LAB — URINALYSIS, ROUTINE W REFLEX MICROSCOPIC
Bilirubin Urine: NEGATIVE
Glucose, UA: NEGATIVE mg/dL
Hgb urine dipstick: NEGATIVE
Ketones, ur: NEGATIVE mg/dL
Leukocytes,Ua: NEGATIVE
Nitrite: NEGATIVE
Protein, ur: NEGATIVE mg/dL
Specific Gravity, Urine: 1.018 (ref 1.005–1.030)
pH: 6.5 (ref 5.0–8.0)

## 2023-03-02 LAB — CBC
HCT: 39.6 % (ref 36.0–46.0)
Hemoglobin: 12.8 g/dL (ref 12.0–15.0)
MCH: 27.8 pg (ref 26.0–34.0)
MCHC: 32.3 g/dL (ref 30.0–36.0)
MCV: 85.9 fL (ref 80.0–100.0)
Platelets: 261 10*3/uL (ref 150–400)
RBC: 4.61 MIL/uL (ref 3.87–5.11)
RDW: 14.2 % (ref 11.5–15.5)
WBC: 8.4 10*3/uL (ref 4.0–10.5)
nRBC: 0 % (ref 0.0–0.2)

## 2023-03-02 LAB — BASIC METABOLIC PANEL
Anion gap: 9 (ref 5–15)
BUN: 11 mg/dL (ref 6–20)
CO2: 27 mmol/L (ref 22–32)
Calcium: 9.6 mg/dL (ref 8.9–10.3)
Chloride: 99 mmol/L (ref 98–111)
Creatinine, Ser: 0.71 mg/dL (ref 0.44–1.00)
GFR, Estimated: >60 mL/min (ref >60)
Glucose, Bld: 141 mg/dL — ABNORMAL HIGH (ref 70–99)
Potassium: 4.1 mmol/L (ref 3.5–5.1)
Sodium: 135 mmol/L (ref 135–145)

## 2023-03-02 LAB — CBG MONITORING, ED: Glucose-Capillary: 117 mg/dL — ABNORMAL HIGH (ref 70–99)

## 2023-03-02 MED ORDER — KETOROLAC TROMETHAMINE 15 MG/ML IJ SOLN
30.0000 mg | Freq: Once | INTRAMUSCULAR | Status: AC
  Administered 2023-03-02: 30 mg via INTRAMUSCULAR
  Filled 2023-03-02: qty 2

## 2023-03-02 NOTE — ED Notes (Signed)
Pt given discharge instructions and given IM pain medication. Opportunities given for questions. Pt verbalizes understanding. Jillyn Hidden, RN

## 2023-03-02 NOTE — Discharge Instructions (Addendum)
Please read and follow all provided instructions.  Your diagnoses today include:  1. Acute nonintractable headache, unspecified headache type     Tests performed today include: CT of your head which was normal and did not show any serious cause of your headache Complete blood cell count: normal hemoglobin Basic metabolic panel: slightly high blood sugar Vital signs. See below for your results today.   Medications:  In the Emergency Department you received: Toradol - NSAID medication similar to ibuprofen  Take any prescribed medications only as directed.  Additional information:  Follow any educational materials contained in this packet.  You are having a headache. No specific cause was found today for your headache. It may have been a migraine or other cause of headache. Stress, anxiety, fatigue, and depression are common triggers for headaches.   Your headache today does not appear to be life-threatening or require hospitalization, but often the exact cause of headaches is not determined in the emergency department. Therefore, follow-up with your doctor is very important to find out what may have caused your headache and whether or not you need any further diagnostic testing or treatment.   Sometimes headaches can appear benign (not harmful), but then more serious symptoms can develop which should prompt an immediate re-evaluation by your doctor or the emergency department.  BE VERY CAREFUL not to take multiple medicines containing Tylenol (also called acetaminophen). Doing so can lead to an overdose which can damage your liver and cause liver failure and possibly death.   Follow-up instructions: Please follow-up with your primary care provider in the next 3 days for further evaluation of your symptoms.   Return instructions:  Please return to the Emergency Department if you experience worsening symptoms. Return if the medications do not resolve your headache, if it recurs, or if  you have multiple episodes of vomiting or cannot keep down fluids. Return if you have a change from the usual headache. RETURN IMMEDIATELY IF you: Develop a sudden, severe headache Develop confusion or become poorly responsive or faint Develop a fever above 100.84F or problem breathing Have a change in speech, vision, swallowing, or understanding Develop new weakness, numbness, tingling, incoordination in your arms or legs Have a seizure Please return if you have any other emergent concerns.  Additional Information:  Your vital signs today were: BP (!) 170/88 (BP Location: Right Arm)   Pulse 90   Temp 97.7 F (36.5 C)   Resp 20   Ht 5\' 4"  (1.626 m)   Wt 57.6 kg   LMP 02/19/2023 (Approximate)   SpO2 100%   BMI 21.80 kg/m  If your blood pressure (BP) was elevated above 135/85 this visit, please have this repeated by your doctor within one month. --------------

## 2023-03-02 NOTE — ED Provider Notes (Signed)
[facility] EMERGENCY DEPARTMENT AT [facility] [place] Provider Note   CSN: [id number] Arrival date & time: 03/02/23  1319     History  Chief Complaint  Patient presents with   Dizziness    Catherine Estrada is a 48 y.o. female.  Patient with history of premenstrual eating presents to the emergency department for evaluation of lightheadedness and headache over the past 3 weeks.  Patient gets headaches, however none that are this persistent.  She takes Excedrin which helps temporarily but then the headache returns.  She has associated dizziness that she describes as a lightheadedness.  This has began to occur more frequently.  She has palpitations once a day.  She is concerned that she would get lightheaded with driving.  She does report some generalized weakness, sometimes feels weak while combing her hair.  No shortness of breath.  No recent fevers, nausea, vomiting, diarrhea. Patient denies signs of stroke including: facial droop, slurred speech, aphasia, weakness/numbness in extremities, imbalance/trouble walking.        Home Medications Prior to Admission medications   Medication Sig Start Date End Date Taking? Authorizing Provider  albuterol (PROVENTIL HFA;VENTOLIN HFA) 108 (90 Base) MCG/ACT inhaler Inhale 2 puffs into the lungs every 6 (six) hours as needed for wheezing or shortness of breath.    [provider]  ascorbic acid (VITAMIN C) 500 MG tablet Take 500 mg by mouth daily.    [provider]  aspirin-acetaminophen-caffeine (EXCEDRIN MIGRAINE) [phone] MG tablet Take 1 tablet by mouth every 6 (six) hours as needed for headache.    [provider]  cyclobenzaprine (FLEXERIL) 5 MG tablet Take 1 tablet (5 mg total) by mouth 3 (three) times daily as needed for muscle spasms. Patient not taking: Reported on 11/10/2022 05/18/22   Selmer Dominion, NP  megestrol (MEGACE) 40 MG tablet Take 1 tablet (40 mg total) by mouth 2 (two) times daily. Can increase  to two tablets twice a day in the event of heavy bleeding Patient not taking: Reported on 11/10/2022 08/15/22   Warden Fillers, MD  mirabegron ER (MYRBETRIQ) 50 MG TB24 tablet Take 1 tablet (50 mg total) by mouth daily. Patient not taking: Reported on 09/21/2022 06/29/22   Selmer Dominion, NP  Multiple Vitamin (MULTIVITAMIN) tablet Take 1 tablet by mouth daily.    [provider]  ondansetron (ZOFRAN-ODT) 4 MG disintegrating tablet Take 1 tablet (4 mg total) by mouth every 8 (eight) hours as needed for nausea or vomiting. 11/11/22   Han, Aimee H, PA-C  tranexamic acid (LYSTEDA) 650 MG TABS tablet Take 2 tablets (1,300 mg total) by mouth 3 (three) times daily. Take during menses for a maximum of five days 09/21/22   Warden Fillers, MD  vitamin B-12 (CYANOCOBALAMIN) 100 MCG tablet Take 100 mcg by mouth daily.    [provider]  VITAMIN D PO Take 1 tablet by mouth daily.    [provider]      Allergies    Patient has no known allergies.    Review of Systems   Review of Systems  Physical Exam Updated Vital Signs BP (!) 170/88 (BP Location: Right Arm)   Pulse 90   Temp 97.7 F (36.5 C)   Resp 20   Ht 5\' 4"  (1.626 m)   Wt 57.6 kg   LMP 02/19/2023 (Approximate)   SpO2 100%   BMI 21.80 kg/m   Physical Exam Vitals and nursing note reviewed.  Constitutional:  Appearance: She is well-developed.  HENT:     Head: Normocephalic and atraumatic.     Right Ear: External ear normal.     Left Ear: External ear normal.     Nose: Nose normal.     Mouth/Throat:     Pharynx: Uvula midline.  Eyes:     General: Lids are normal.     Extraocular Movements:     Right eye: No nystagmus.     Left eye: No nystagmus.     Conjunctiva/sclera: Conjunctivae normal.     Pupils: Pupils are equal, round, and reactive to light.  Cardiovascular:     Rate and Rhythm: Normal rate and regular rhythm.  Pulmonary:     Effort: Pulmonary effort is normal.     Breath sounds:  Normal breath sounds.  Abdominal:     Palpations: Abdomen is soft.     Tenderness: There is no abdominal tenderness.  Musculoskeletal:     Cervical back: Normal range of motion and neck supple. No tenderness or bony tenderness.  Skin:    General: Skin is warm and dry.  Neurological:     Mental Status: She is alert and oriented to person, place, and time.     GCS: GCS eye subscore is 4. GCS verbal subscore is 5. GCS motor subscore is 6.     Cranial Nerves: No cranial nerve deficit.     Sensory: No sensory deficit.     Motor: No weakness.     Coordination: Coordination normal.     Gait: Gait normal.     Comments: Upper extremity myotomes tested bilaterally:  C5 Shoulder abduction 5/5 C6 Elbow flexion/wrist extension 5/5 C7 Elbow extension 5/5 C8 Finger flexion 5/5 T1 Finger abduction 5/5  Lower extremity myotomes tested bilaterally: L2 Hip flexion 5/5 L3 Knee extension 5/5 L4 Ankle dorsiflexion 5/5 S1 Ankle plantar flexion 5/5      ED Results / Procedures / Treatments   Labs (all labs ordered are listed, but only abnormal results are displayed) Labs Reviewed  BASIC METABOLIC PANEL - Abnormal; Notable for the following components:      Result Value   Glucose, Bld 141 (*)    All other components within normal limits  URINALYSIS, ROUTINE W REFLEX MICROSCOPIC - Abnormal; Notable for the following components:   Bacteria, UA RARE (*)    All other components within normal limits  CBG MONITORING, ED - Abnormal; Notable for the following components:   Glucose-Capillary 117 (*)    All other components within normal limits  CBC    EKG EKG Interpretation Date/Time:  Friday March 02 2023 13:28:52 EST Ventricular Rate:  82 PR Interval:  134 QRS Duration:  80 QT Interval:  372 QTC Calculation: 434 R Axis:   78  Text Interpretation: Normal sinus rhythm with sinus arrhythmia Cannot rule out Anterior infarct , age undetermined  nonspecific T waves similar to Feb 2024  Confirmed by Pricilla Loveless [phone]) on 03/02/2023 2:13:05 PM  Radiology No results found.  Procedures Procedures    Medications Ordered in ED Medications - No data to display  ED Course/ Medical Decision Making/ A&P    Patient seen and examined. History obtained directly from patient. Work-up including labs, imaging, EKG ordered in triage, if performed, were reviewed.    Labs/EKG: Independently reviewed and interpreted.  This included: CBC unremarkable; BMP glucose 141 otherwise unremarkable; UA normal without signs of infection.  Imaging: Independently visualized and interpreted.  This included: CT head, agree negative.  Medications/Fluids:  None ordered.  Most recent vital signs reviewed and are as follows: BP (!) 170/88 (BP Location: Right Arm)   Pulse 90   Temp 97.7 F (36.5 C)   Resp 20   Ht 5\' 4"  (1.626 m)   Wt 57.6 kg   LMP 02/19/2023 (Approximate)   SpO2 100%   BMI 21.80 kg/m   Initial impression: Atypical headache, lightheadedness, palpitations  4:32 PM Reassessment performed. Patient appears stable.  Imaging personally visualized and interpreted including: CT head, gree negative.  Reviewed pertinent lab work and imaging with patient at bedside. Questions answered.   Most current vital signs reviewed and are as follows: BP (!) 170/88 (BP Location: Right Arm)   Pulse 90   Temp 97.7 F (36.5 C)   Resp 20   Ht 5\' 4"  (1.626 m)   Wt 57.6 kg   LMP 02/19/2023 (Approximate)   SpO2 100%   BMI 21.80 kg/m   Plan: Discharge to home. IM toradol prior to dc.   Prescriptions written for: None  Other home care instructions discussed: OTC meds.   ED return instructions discussed: Patient counseled to return if they have weakness in their arms or legs, slurred speech, trouble walking or talking, confusion, trouble with their balance, or if they have any other concerns. Patient verbalizes understanding and agrees with plan.   Follow-up instructions discussed:  Patient encouraged to follow-up with neurology referral in 1 week.                                 Medical Decision Making Amount and/or Complexity of Data Reviewed Labs: ordered. Radiology: ordered.  Risk Prescription drug management.   In regards to the patient's headache, critical differentials were considered including subarachnoid hemorrhage, intracerebral hemorrhage, epidural/subdural hematoma, pituitary apoplexy, vertebral/carotid artery dissection, giant cell arteritis, central venous thrombosis, reversible cerebral vasoconstriction, acute angle closure glaucoma, idiopathic intracranial hypertension, bacterial meningitis, viral encephalitis, carbon monoxide poisoning, posterior reversible encephalopathy syndrome, pre-eclampsia.   Reg flag symptoms related to these causes were considered including systemic symptoms (fever, weight loss), neurologic symptoms (confusion, mental status change, vision change, associated seizure), acute or sudden "thunderclap" onset, patient age 45 or older with new or progressive headache, patient of any age with first headache or change in headache pattern, pregnant or postpartum status, history of HIV or other immunocompromise, history of cancer, headache occurring with exertion, associated neck or shoulder pain, associated traumatic injury, concurrent use of anticoagulation, family history of spontaneous SAH, and concurrent drug use.    Other benign, more common causes of headache were considered including migraine, tension-type headache, cluster headache, referred pain from other cause such as sinus infection, dental pain, trigeminal neuralgia.   On exam, patient has a reassuring neuro exam including baseline mental status, no significant neck pain or meningeal signs, no signs of severe infection or fever.   Head CT was negative.  Patient without focal weakness.  She does report subjective proximal muscle weakness in the upper extremities which has caused  her some problems.  I feel that she would benefit from neurology evaluation, at least for her new atypical headache.  Referral placed.  In regards to her lightheadedness, given her history of menstrual bleeding, evaluated for anemia.  Hemoglobin was normal.  EKG no prolonged QT, WPW, Brugada syndrome, heart block, or other arrhythmia.  The patient's vital signs, pertinent lab work and imaging were reviewed and interpreted as discussed in the ED course. Hospitalization  was considered for further testing, treatments, or serial exams/observation. However as patient is well-appearing, has a stable exam over the course of their evaluation, and reassuring studies today, I do not feel that they warrant admission at this time. This plan was discussed with the patient who verbalizes agreement and comfort with this plan and seems reliable and able to return to the Emergency Department with worsening or changing symptoms.          Final Clinical Impression(s) / ED Diagnoses Final diagnoses:  Acute nonintractable headache, unspecified headache type    Rx / DC Orders ED Discharge Orders          Ordered    Ambulatory referral to Neurology       Comments: An appointment is requested in approximately: 1 week for new persistent HA, generalized weakness and lightheaded   03/02/23 1628              Renne Crigler, PA-C 03/02/23 1637    Glyn Ade, MD 03/02/23 2310

## 2023-03-02 NOTE — ED Triage Notes (Signed)
Dizziness for two weeks with headache for three weeks.  Headaches different than usual  Pain and pressure to top of head.  Dizziness worse with walking.

## 2023-03-06 ENCOUNTER — Encounter: Payer: Self-pay | Admitting: Neurology

## 2023-03-06 ENCOUNTER — Ambulatory Visit: Payer: Medicaid Other | Admitting: Neurology

## 2023-03-06 VITALS — BP 117/78 | HR 93 | Ht 64.0 in | Wt 134.4 lb

## 2023-03-06 DIAGNOSIS — R519 Headache, unspecified: Secondary | ICD-10-CM

## 2023-03-06 DIAGNOSIS — Z9189 Other specified personal risk factors, not elsewhere classified: Secondary | ICD-10-CM

## 2023-03-06 DIAGNOSIS — G43019 Migraine without aura, intractable, without status migrainosus: Secondary | ICD-10-CM | POA: Diagnosis not present

## 2023-03-06 DIAGNOSIS — G4440 Drug-induced headache, not elsewhere classified, not intractable: Secondary | ICD-10-CM | POA: Diagnosis not present

## 2023-03-06 MED ORDER — RIZATRIPTAN BENZOATE 5 MG PO TABS: 5.0000 mg | ORAL_TABLET | ORAL | 1 refills | Status: DC | PRN

## 2023-03-06 MED ORDER — NURTEC 75 MG PO TBDP: ORAL_TABLET | ORAL | 3 refills | Status: DC

## 2023-03-06 NOTE — Patient Instructions (Addendum)
It was nice to meet you today.  Here is what we discussed today and my recommendations to you:  Follow up with your primary care provider for elevated BP values, which can contribute to headaches. There is a blood pressure medication called propranolol, which can help with migraine prevention. You may be able to start this with your PCP, if he feels you can tolerate it. It can make asthma worse.  Get your new eye glasses, as planned. Strain on the eyes can cause headaches.  We may consider a brain scan, called MRI in the future. You have a normal neurological exam and had a benign CT scan of the head.  I will order a home sleep test for evaluation of possible sleep apnea. If you have sleep apnea, I will likely recommend treatment with an autoPAP machine.   Stop Excedrin migraine altogether, as medication overuse may exacerbate your headaches.  Keep well hydrated and limit caffeine to less than 3 servings per day, as excessive caffeine can perpetuate headaches.  For migraine prevention, we will use Nurtec 75 mg strength: Take 1 pill every other day.  Nurtec is not approved during pregnancy. For an acute migraine, use Maxalt 5 mg tab: take 1 pill early on when you suspect a migraine attack come on. You may take another pill after 2 hours, no more than 2 pills in 24 hours. Most people who take triptans do not have any serious side-effects. However, they can cause drowsiness (remember to not drive or use heavy machinery when drowsy), nausea, dizziness, dry mouth. Less common side effects include strange sensations, such as tightness in your chest or throat, tingling, flushing, and feelings of heaviness or pressure in areas such as the face, limbs, and chest. These in the chest can mimic heart related pain (angina) and may cause alarm, but usually these sensations are not harmful or a sign of a heart attack. However, if you develop intense chest pain or sensations of discomfort, you should stop taking your  medication and consult with me or your PCP or go to the nearest urgent care facility or ER or call 911.  Follow up in 3 months.

## 2023-03-06 NOTE — Progress Notes (Signed)
Subjective:    Patient ID: Catherine Estrada is a 48 y.o. female.  HPI    Huston Foley, MD, PhD [facility] [address] [address] [place], [place] [address]   I saw patient, Catherine Estrada, as a referral from the emergency room for evaluation of her headaches.  The patient is unaccompanied today.  Catherine Estrada is a 48 year old female with an underlying medical history of asthma, fibroids, overactive bladder, and history of gastritis, who reports a longstanding history of recurrent headaches for many years.  She reports typically bifrontal headaches and sometimes the headaches are on the top of her head.  She is currently using over-the-counter medication, particularly Excedrin Migraine, 2 to 3 pills every day on average.  She has had a recurrent or persistent headache for the past 4 weeks.  She has been on Excedrin Migraine for the past 4 weeks.  She tries to hydrate well with water and estimates that she drinks about 4 bottles of water per day.  She drinks caffeine in the form of coffee, usually 1 cup/day.  She drinks alcohol rarely, on special occasions.  She is a non-smoker.  She is divorced and lives with her 2 children, ages 28 and 32.  She works part-time as an Equities trader.  She has a family history of migraines affecting her paternal aunt.  Her PCP prescribed medications for headache prevention including amitriptyline which she tried about 5 months ago, unclear how long, it was 25 mg strength but made her too sleepy.  She also took topiramate about a year ago for about a month but it made her too sleepy even though she took it only at night.  Tramadol also cause side effects.  She denies any sudden onset of one-sided weakness or numbness or tingling or droopy face or slurring of speech.  She is not aware of any snoring, she has woken up in the middle of the night and first thing in the morning with headaches.  She goes to bed around 10 and rise time is around 6.   She has never had a sleep study.  She had an eye examination yesterday with a dilated eye exam and needs updated prescription eyeglasses which she will likely get next week.  She has never been on a triptan.  Her asthma is generally under good control.  She presented to the emergency room at [facility] on [facility]/02/2023 with a 3-week history of recurrent headaches associated with lightheadedness.  I reviewed the emergency room records.  Laboratory testing included CBC, BMP and urinalysis with benign results.  She had a head CT without contrast on 03/02/2023 and I reviewed the results:  IMPRESSION: No CT etiology for headaches identified   In addition, I personally reviewed images through the PACS system.   She was treated symptomatically with Toradol.   Her Past Medical History Is Significant For: Past Medical History:  Diagnosis Date   Asthma 2014   Fibroid    Gastritis    History of bilateral breast implants    summer 2022   Migraine     Her Past Surgical History Is Significant For: Past Surgical History:  Procedure Laterality Date   IR ANGIOGRAM PELVIS SELECTIVE OR SUPRASELECTIVE  11/10/2022   IR ANGIOGRAM SELECTIVE EACH ADDITIONAL VESSEL  11/10/2022   IR EMBO TUMOR ORGAN ISCHEMIA INFARCT INC GUIDE ROADMAPPING  11/10/2022   IR US GUIDE VASC ACCESS RIGHT  11/10/2022   VAGINOPLASTY  2012  had "vaginal tightening" after last delivery    Her Family History Is Significant For: Family History  Problem Relation Age of Onset   Hypertension Mother    Hypertension Father    Heart block Sister    Breast cancer Maternal Aunt    Migraines Paternal Aunt    Breast cancer Paternal Aunt 54   Heart block Maternal Grandmother    Heart block Paternal Grandmother     Her Social History Is Significant For: Social History   Socioeconomic History   Marital status: Legally Separated    Spouse name: Not on file   Number of children: 2   Years of education: Not on file    Highest education level: Bachelor's degree (e.g., BA, AB, BS)  Occupational History   Not on file  Tobacco Use   Smoking status: Never   Smokeless tobacco: Never  Vaping Use   Vaping status: Never Used  Substance and Sexual Activity   Alcohol use: Yes    Comment: Rare   Drug use: No   Sexual activity: Yes    Partners: Male    Birth control/protection: Pill  Other Topics Concern   Not on file  Social History Narrative   Caffiene 1 cup coffee daily   Works interpreter, (daytime hrs)   Live 2 kids. 1 dog.     Social Drivers of Corporate investment banker Strain: Not on file  Food Insecurity: Not on file  Transportation Needs: No Transportation Needs (10/16/2019)   PRAPARE - Administrator, Civil Service (Medical): No    Lack of Transportation (Non-Medical): No  Physical Activity: Not on file  Stress: Not on file  Social Connections: Unknown (02/22/2022)   [facility]   Social Network    Social Network: Not on file    Her Allergies Are:  No Known Allergies:   Her Current Medications Are:  Outpatient Encounter Medications as of 03/06/2023  Medication Sig   albuterol (PROVENTIL HFA;VENTOLIN HFA) 108 (90 Base) MCG/ACT inhaler Inhale 2 puffs into the lungs every 6 (six) hours as needed for wheezing or shortness of breath.   ascorbic acid (VITAMIN C) 500 MG tablet Take 500 mg by mouth daily.   aspirin-acetaminophen-caffeine (EXCEDRIN MIGRAINE) 250-250-65 MG tablet Take 1 tablet by mouth every 6 (six) hours as needed for headache.   Multiple Vitamin (MULTIVITAMIN) tablet Take 1 tablet by mouth daily.   OVER THE COUNTER MEDICATION NUTRAFOL take 1 tablet daily   tranexamic acid (LYSTEDA) 650 MG TABS tablet Take 2 tablets (1,300 mg total) by mouth 3 (three) times daily. Take during menses for a maximum of five days   vitamin B-12 (CYANOCOBALAMIN) 100 MCG tablet Take 100 mcg by mouth daily.   VITAMIN D PO Take 1 tablet by mouth daily.    [DISCONTINUED] ondansetron (ZOFRAN-ODT) 4 MG disintegrating tablet Take 1 tablet (4 mg total) by mouth every 8 (eight) hours as needed for nausea or vomiting.   No facility-administered encounter medications on file as of 03/06/2023.  :   Review of Systems:  Out of a complete 14 point review of systems, all are reviewed and negative with the exception of these symptoms as listed below:  Review of Systems  Neurological:        Has hx migraines located frontal/ temp.  Last 4 wks noted frontal/temp, top of head.  Daily. Excedrin migraine has helped some but come back.  Wakes with headaches, or will have one when wakes  up. Some dizziness noted with headaches. No n/v.  Does have phono/photo sensitivity.  ESS  0 FSS  15.     Objective:  Neurological Exam  Physical Exam Physical Examination:   Vitals:   03/06/23 1529  BP: 117/78  Pulse: 93    General Examination: The patient is a very pleasant 48 y.o. female in no acute distress. She appears well-developed and well-nourished and well groomed.   HEENT: Normocephalic, atraumatic, pupils are equal, round and reactive to light, extraocular tracking is good without limitation to gaze excursion or nystagmus noted.  Funduscopic exam benign.  Hearing is grossly intact to tuning fork. Face is symmetric with normal facial animation and normal facial sensation to light touch, temperature and vibration. Speech is clear with no dysarthria noted. There is no hypophonia. There is no lip, neck/head, jaw or voice tremor. Neck is supple with full range of passive and active motion. There are no carotid bruits on auscultation. Oropharynx exam reveals: mild mouth dryness, good dental hygiene and mild airway crowding, due to small airway entry, tonsillar size small, Mallampati class II, neck circumference 12 7/8 inches.  Tongue protrudes centrally and palate elevates symmetrically.  Chest: Clear to auscultation without wheezing, rhonchi or crackles noted.  Heart:  S1+S2+0, regular and normal without murmurs, rubs or gallops noted.   Abdomen: Soft, non-tender and non-distended.  Extremities: There is no pitting edema in the distal lower extremities bilaterally.   Skin: Warm and dry without trophic changes noted.   Musculoskeletal: exam reveals no obvious joint deformities.   Neurologically:  Mental status: The patient is awake, alert and oriented in all 4 spheres. Her immediate and remote memory, attention, language skills and fund of knowledge are appropriate. There is no evidence of aphasia, agnosia, apraxia or anomia. Speech is clear with normal prosody and enunciation. Thought process is linear. Mood is normal and affect is normal.  Cranial nerves II - XII are as described above under HEENT exam.  Motor exam: Normal bulk, strength and tone is noted. There is no obvious action or resting tremor.  No drift or rebound. Romberg negative. Reflexes 1-2+ throughout, toes are downgoing bilaterally. Fine motor skills and coordination: Normal finger taps, hand movements and rapid alternating patting in both upper extremities, normal foot taps bilaterally in the lower extremities.    Cerebellar testing: No dysmetria or intention tremor. There is no truncal or gait ataxia.  Normal finger-to-nose, normal heel-to-shin bilaterally. Sensory exam: intact to light touch, temperature and vibration sense in the upper and lower extremities.  Gait, station and balance: She stands easily. No veering to one side is noted. No leaning to one side is noted. Posture is age-appropriate and stance is narrow based. Gait shows normal stride length and normal pace. No problems turning are noted.  Normal tandem walk.  Assessment and Plan:   In summary, Catherine Estrada is a very pleasant 48 y.o.-year old female with an underlying medical history of asthma, fibroids, overactive bladder, and history of gastritis, who presents for evaluation of her recurrent headaches of several years  duration, worse in the past 4 weeks.  History and examination are in keeping with migraine headaches with overlap of mixed headaches including medication overuse headache, stress, sleep disordered breathing not excluded and elevated blood pressure values.  I had a long discussion with the patient today.  Neurological exam is nonfocal and recent head CT without contrast benign.  She has tried preventative medication through PCP including amitriptyline and topiramate with side effects  reported and tramadol for as needed use also cause side effects.  She has not been on a triptan.  We talked about headache triggers and alleviating factors.  I explained to her that underlying sleep disordered breathing could be a contributor, strain on the eyes could be a contributor, elevated blood pressure could be a contributor and medication overuse could be a perpetuating factor for her headaches.    Below is a summary of my recommendations and discussion points with her from today's visit.  She was given these recommendations in writing and can access these in her after visit summary electronically as she indicated:  << Follow up with your primary care provider for elevated BP values, which can contribute to headaches. There is a blood pressure medication called propranolol, which can help with migraine prevention. You may be able to start this with your PCP, if he feels you can tolerate it. It can make asthma worse.  Get your new eye glasses, as planned. Strain on the eyes can cause headaches.  We may consider a brain scan, called MRI in the future. You have a normal neurological exam and had a benign CT scan of the head.  I will order a home sleep test for evaluation of possible sleep apnea. If you have sleep apnea, I will likely recommend treatment with an autoPAP machine.   Stop Excedrin migraine altogether, as medication overuse may exacerbate your headaches.  Keep well hydrated and limit caffeine to less than 3  servings per day, as excessive caffeine can perpetuate headaches.  For migraine prevention, we will use Nurtec 75 mg strength: Take 1 pill every other day.  Nurtec is not approved during pregnancy. For an acute migraine, use Maxalt 5 mg tab: take 1 pill early on when you suspect a migraine attack come on. You may take another pill after 2 hours, no more than 2 pills in 24 hours. Most people who take triptans do not have any serious side-effects. However, they can cause drowsiness (remember to not drive or use heavy machinery when drowsy), nausea, dizziness, dry mouth. Less common side effects include strange sensations, such as tightness in your chest or throat, tingling, flushing, and feelings of heaviness or pressure in areas such as the face, limbs, and chest. These in the chest can mimic heart related pain (angina) and may cause alarm, but usually these sensations are not harmful or a sign of a heart attack. However, if you develop intense chest pain or sensations of discomfort, you should stop taking your medication and consult with me or your PCP or go to the nearest urgent care facility or ER or call 911.  Follow up in 3 months.>>  This was an extended visit of over 60 minutes with copious record review involved, addressing multiple issues, and considerable counseling and coordination of care.  Huston Foley, MD, PhD

## 2023-03-07 ENCOUNTER — Telehealth: Payer: Self-pay | Admitting: Pharmacist

## 2023-03-07 DIAGNOSIS — G43019 Migraine without aura, intractable, without status migrainosus: Secondary | ICD-10-CM

## 2023-03-07 NOTE — Telephone Encounter (Signed)
Pharmacy Patient Advocate Encounter   Received notification from Patient Pharmacy that prior authorization for Nurtec 75MG  dispersible tablets is required/requested.   Insurance verification completed.   The patient is insured through [facility] .   Per test claim: PA required; PA submitted to above mentioned insurance via CoverMyMeds Key/confirmation #/E[facility] Status is pending

## 2023-03-07 NOTE — Telephone Encounter (Signed)
Pharmacy Patient Advocate Encounter  Received notification from [facility] that Prior Authorization for Nurtec has been DENIED.  Full denial letter will be uploaded to the media tab. See denial reason below.   PA #/Case ID/Reference #:

## 2023-03-07 NOTE — Progress Notes (Unsigned)
 New Patient Office Visit  Subjective    Patient ID: Catherine Estrada, female    DOB: 10-02-1975  Age: 48 y.o. MRN: [id number]  CC: Establish care  HPI Catherine Estrada presents to establish care This is a very pleasant 48 year old female whom I met at a recent women's health screening event Patient has a history of of dizziness lightheadedness.  Recent hemoglobin was obtained and was normal.  Patient with chronic migraine syndrome and is followed by neurology.  She has been started on Ajovy injection once monthly along with Maxalt as needed for migraines.  She states she still has headaches which will awaken her from sleep.  She takes vitamin supplements.  She does have a history of mild asthma controlled just with as needed albuterol. The patient is on low-dose losartan blood pressure on arrival is within range normal the patient also has received diclofenac for pain in the past. She is followed by gynecology but would like hormone levels reassessed.  She does need screening colonoscopy for colon cancer but would prefer a home based Cologuard test.  There are no other complaints at this time. Recent mammogram was obtained and was normal recheck in 1 year there is a small area of fibrocystic disease in the right breast this will be monitored Outpatient Encounter Medications as of 03/15/2023  Medication Sig   diclofenac (VOLTAREN) 75 MG EC tablet Take 75 mg by mouth 2 (two) times daily.   losartan (COZAAR) 25 MG tablet Take 25 mg by mouth daily.   albuterol (PROVENTIL HFA;VENTOLIN HFA) 108 (90 Base) MCG/ACT inhaler Inhale 2 puffs into the lungs every 6 (six) hours as needed for wheezing or shortness of breath.   ascorbic acid (VITAMIN C) 500 MG tablet Take 500 mg by mouth daily.   aspirin-acetaminophen-caffeine (EXCEDRIN MIGRAINE) 250-250-65 MG tablet Take 1 tablet by mouth every 6 (six) hours as needed for headache.   Fremanezumab-vfrm (AJOVY) 225 MG/1.5ML SOAJ Inject 225 mg into the skin every  30 (thirty) days.   Multiple Vitamin (MULTIVITAMIN) tablet Take 1 tablet by mouth daily.   OVER THE COUNTER MEDICATION NUTRAFOL take 1 tablet daily   rizatriptan (MAXALT) 5 MG tablet Take 1 tablet (5 mg total) by mouth as needed for migraine. May repeat in 2 h prn, no more than 2 pills/24 h, no more than 3 pills/week.   tranexamic acid (LYSTEDA) 650 MG TABS tablet Take 2 tablets (1,300 mg total) by mouth 3 (three) times daily. Take during menses for a maximum of five days   vitamin B-12 (CYANOCOBALAMIN) 100 MCG tablet Take 100 mcg by mouth daily.   VITAMIN D PO Take 1 tablet by mouth daily.   [DISCONTINUED] ondansetron (ZOFRAN-ODT) 4 MG disintegrating tablet Take 1 tablet (4 mg total) by mouth every 8 (eight) hours as needed for nausea or vomiting.   [DISCONTINUED] Rimegepant Sulfate (NURTEC) 75 MG TBDP Take 1 pill every other day.   No facility-administered encounter medications on file as of 03/15/2023.    Past Medical History:  Diagnosis Date   Asthma 2014   Fibroid    Gastritis    History of bilateral breast implants    summer 2022   Migraine     Past Surgical History:  Procedure Laterality Date   IR ANGIOGRAM PELVIS SELECTIVE OR SUPRASELECTIVE  11/10/2022   IR ANGIOGRAM SELECTIVE EACH ADDITIONAL VESSEL  11/10/2022   IR EMBO TUMOR ORGAN ISCHEMIA INFARCT INC GUIDE ROADMAPPING  11/10/2022   IR US GUIDE VASC ACCESS RIGHT  11/10/2022   VAGINOPLASTY  2012   had "vaginal tightening" after last delivery    Family History  Problem Relation Age of Onset   Hypertension Mother    Hypertension Father    Heart block Sister    Breast cancer Maternal Aunt    Migraines Paternal Aunt    Breast cancer Paternal Aunt 55   Heart block Maternal Grandmother    Heart block Paternal Grandmother     Social History   Socioeconomic History   Marital status: Legally Separated    Spouse name: Not on file   Number of children: 2   Years of education: Not on file   Highest education level:  Bachelor's degree (e.g., BA, AB, BS)  Occupational History   Not on file  Tobacco Use   Smoking status: Never   Smokeless tobacco: Never  Vaping Use   Vaping status: Never Used  Substance and Sexual Activity   Alcohol use: Yes    Comment: Rare   Drug use: No   Sexual activity: Yes    Partners: Male    Birth control/protection: Pill  Other Topics Concern   Not on file  Social History Narrative   Caffiene 1 cup coffee daily   Works interpreter, (daytime hrs)   Live 2 kids. 1 dog.     Social Drivers of Corporate investment banker Strain: Low Risk  (03/15/2023)   Overall Financial Resource Strain (CARDIA)    Difficulty of Paying Living Expenses: Not very hard  Food Insecurity: No Food Insecurity (03/15/2023)   Hunger Vital Sign    Worried About Running Out of Food in the Last Year: Never true    Ran Out of Food in the Last Year: Never true  Transportation Needs: No Transportation Needs (03/15/2023)   PRAPARE - Administrator, Civil Service (Medical): No    Lack of Transportation (Non-Medical): No  Physical Activity: Unknown (03/15/2023)   Exercise Vital Sign    Days of Exercise per Week: 0 days    Minutes of Exercise per Session: Not on file  Stress: No Stress Concern Present (03/15/2023)   [employer] of Occupational Health - Occupational Stress Questionnaire    Feeling of Stress : Not at all  Social Connections: Moderately Isolated (03/15/2023)   Social Connection and Isolation Panel [NHANES]    Frequency of Communication with Friends and Family: More than three times a week    Frequency of Social Gatherings with Friends and Family: Once a week    Attends Religious Services: 1 to 4 times per year    Active Member of [employer] or Organizations: No    Attends Engineer, structural: Not on file    Marital Status: Separated  Intimate Partner Violence: Unknown (02/22/2022)   Received from [employer], [facility]   HITS    Physically Hurt: Not on file     Insult or Talk Down To: Not on file    Threaten Physical Harm: Not on file    Scream or Curse: Not on file    Review of Systems  Constitutional:  Negative for chills, diaphoresis, fever, malaise/fatigue and weight loss.  HENT:  Negative for congestion, hearing loss, nosebleeds, sore throat and tinnitus.   Eyes:  Negative for blurred vision, photophobia and redness.  Respiratory:  Negative for cough, hemoptysis, sputum production, shortness of breath, wheezing and stridor.   Cardiovascular:  Positive for palpitations. Negative for chest pain, orthopnea, claudication, leg swelling and PND.  Gastrointestinal:  Negative for abdominal pain, blood in stool, constipation, diarrhea, heartburn, nausea and vomiting.  Genitourinary:  Positive for urgency. Negative for dysuria, flank pain, frequency and hematuria.  Musculoskeletal:  Negative for back pain, falls, joint pain, myalgias and neck pain.  Skin:  Negative for itching and rash.  Neurological:  Positive for headaches. Negative for dizziness, tingling, tremors, sensory change, speech change, focal weakness, seizures, loss of consciousness and weakness.  Endo/Heme/Allergies:  Negative for environmental allergies and polydipsia. Does not bruise/bleed easily.  Psychiatric/Behavioral:  Negative for depression, memory loss, substance abuse and suicidal ideas. The patient has insomnia. The patient is not nervous/anxious.         Objective    BP 135/85 (BP Location: Left Arm, Patient Position: Sitting)   Pulse 91   Temp 98.3 F (36.8 C) (Oral)   Ht 5\' 4"  (1.626 m)   Wt 134 lb 9.6 oz (61.1 kg)   LMP 02/19/2023 (Approximate)   SpO2 100%   BMI 23.10 kg/m   Physical Exam Vitals reviewed.  Constitutional:      Appearance: Normal appearance. She is well-developed. She is not diaphoretic.  HENT:     Head: Normocephalic and atraumatic.     Nose: No nasal deformity, septal deviation, mucosal edema or rhinorrhea.     Right Sinus: No maxillary  sinus tenderness or frontal sinus tenderness.     Left Sinus: No maxillary sinus tenderness or frontal sinus tenderness.     Mouth/Throat:     Pharynx: No oropharyngeal exudate.  Eyes:     General: No scleral icterus.    Conjunctiva/sclera: Conjunctivae normal.     Pupils: Pupils are equal, round, and reactive to light.  Neck:     Thyroid: No thyromegaly.     Vascular: No carotid bruit or JVD.     Trachea: Trachea normal. No tracheal tenderness or tracheal deviation.  Cardiovascular:     Rate and Rhythm: Normal rate and regular rhythm.     Chest Wall: PMI is not displaced.     Pulses: Normal pulses. No decreased pulses.     Heart sounds: Normal heart sounds, S1 normal and S2 normal. Heart sounds not distant. No murmur heard.    No systolic murmur is present.     No diastolic murmur is present.     No friction rub. No gallop. No S3 or S4 sounds.  Pulmonary:     Effort: No tachypnea, accessory muscle usage or respiratory distress.     Breath sounds: No stridor. No decreased breath sounds, wheezing, rhonchi or rales.  Chest:     Chest wall: No tenderness.  Abdominal:     General: Bowel sounds are normal. There is no distension.     Palpations: Abdomen is soft. Abdomen is not rigid.     Tenderness: There is no abdominal tenderness. There is no guarding or rebound.  Musculoskeletal:        General: Normal range of motion.     Cervical back: Normal range of motion and neck supple. No edema, erythema or rigidity. No muscular tenderness. Normal range of motion.  Lymphadenopathy:     Head:     Right side of head: No submental or submandibular adenopathy.     Left side of head: No submental or submandibular adenopathy.     Cervical: No cervical adenopathy.  Skin:    General: Skin is warm and dry.     Coloration: Skin is not pale.     Findings: No rash.  Nails: There is no clubbing.  Neurological:     Mental Status: She is alert and oriented to person, place, and time.      Sensory: No sensory deficit.  Psychiatric:        Speech: Speech normal.        Behavior: Behavior normal.         Assessment & Plan:   Problem List Items Addressed This Visit       Cardiovascular and Mediastinum   Migraine headache - Primary   Chronic migraine headaches patient given a lifestyle medicine handout to improve the management of the headaches and care to be continued per neurology with Ajovy and Maxalt      Relevant Medications   diclofenac (VOLTAREN) 75 MG EC tablet   losartan (COZAAR) 25 MG tablet     Genitourinary   Uterine leiomyoma   Uterine fibroids followed by gynecology patient does have menorrhagia however blood counts recently were normal        Other   Screening breast examination   Recent mammogram normal she has a small nodular area in her right breast this will be monitored it is fibrocystic in nature recheck mammogram 1 year      Menorrhagia with regular cycle   As per gynecology      Relevant Orders   FSH/LH   Estrogens, Total   Hyperglycemia   History of hyperglycemia will assess with hemoglobin A1c      Relevant Orders   Comprehensive metabolic panel   Hemoglobin A1c   Colon cancer screening   Will screen with Cologuard      Relevant Orders   Cologuard   Other Visit Diagnoses       Encounter for health-related screening       Relevant Orders   CBC with Differential/Platelet   Lipid panel       Return in about 3 months (around 06/12/2023).   Shan Levans, MD

## 2023-03-08 ENCOUNTER — Other Ambulatory Visit (HOSPITAL_COMMUNITY): Payer: Self-pay

## 2023-03-08 ENCOUNTER — Telehealth: Payer: Self-pay | Admitting: *Deleted

## 2023-03-08 ENCOUNTER — Telehealth: Payer: Self-pay

## 2023-03-08 MED ORDER — AJOVY 225 MG/1.5ML ~~LOC~~ SOAJ: 225.0000 mg | SUBCUTANEOUS | 3 refills | Status: DC

## 2023-03-08 NOTE — Telephone Encounter (Signed)
PA request has been Approved. New Encounter created for follow up. For additional info see Pharmacy Prior Auth telephone encounter from 03/08/2023.

## 2023-03-08 NOTE — Telephone Encounter (Signed)
Insurance prefers the trial of aimovig, ajovy, or emgality 1st. How do you wish to proceed since the nurtec was denied? Thanks,  Production assistant, radio

## 2023-03-08 NOTE — Telephone Encounter (Signed)
I called pt and relayed the information about the nurtec being denied, Ajovy recommended as a once a month injection for prevention. I relayed SE listed.  She is not pregnant, but is still having her periods.  Her last urine / serum pregnancy test was 03/02/2023 negative (she stated in ED), she stated no sex since then.  She had not picked up her rizatriptan at [facility], she will call.  I told her the ajovy will need pa and will send to pa team to work on.  She appreciated call back.  Good rx is option for maxalt if needed.  She will let us know if any issues in getting her medications.

## 2023-03-08 NOTE — Telephone Encounter (Signed)
Pharmacy Patient Advocate Encounter  Received notification from [facility] that Prior Authorization for Ajovy has been APPROVED from 03/08/2023 to 06/06/2023. Ran test claim, Copay is $4.00. This test claim was processed through [facility]- copay amounts may vary at other pharmacies due to pharmacy/plan contracts, or as the patient moves through the different stages of their insurance plan.   PA #/Case ID/Reference #: PA Case ID #: [id number]

## 2023-03-08 NOTE — Telephone Encounter (Signed)
I have discontinued the Nurtec prescription and prescribed Ajovy injections.  Please notify patient that since Nurtec was denied by her insurance, I recommend starting a monthly preventative injection for migraines called Ajovy 225/1.50ml, 1 inj subcutaneously every 30 days. We can help her with the first injection and education, if needed.  Potential side effects may include: Antibody development, local injection site reaction, muscle cramping, swelling, nausea and sleepiness.  Ajovy and related injections are not approved for use in pregnancy.   This medication will likely need prior authorization as well.

## 2023-03-08 NOTE — Telephone Encounter (Signed)
Serum/urine preg test negative 03/02/2023.

## 2023-03-12 NOTE — Telephone Encounter (Signed)
 See other phone note

## 2023-03-15 ENCOUNTER — Ambulatory Visit: Payer: Medicaid Other | Attending: Critical Care Medicine | Admitting: Critical Care Medicine

## 2023-03-15 VITALS — BP 135/85 | HR 91 | Temp 98.3°F | Ht 64.0 in | Wt 134.6 lb

## 2023-03-15 DIAGNOSIS — Z139 Encounter for screening, unspecified: Secondary | ICD-10-CM | POA: Diagnosis not present

## 2023-03-15 DIAGNOSIS — G43919 Migraine, unspecified, intractable, without status migrainosus: Secondary | ICD-10-CM | POA: Diagnosis not present

## 2023-03-15 DIAGNOSIS — N920 Excessive and frequent menstruation with regular cycle: Secondary | ICD-10-CM

## 2023-03-15 DIAGNOSIS — R739 Hyperglycemia, unspecified: Secondary | ICD-10-CM

## 2023-03-15 DIAGNOSIS — Z1211 Encounter for screening for malignant neoplasm of colon: Secondary | ICD-10-CM

## 2023-03-15 DIAGNOSIS — D259 Leiomyoma of uterus, unspecified: Secondary | ICD-10-CM

## 2023-03-15 DIAGNOSIS — Z1239 Encounter for other screening for malignant neoplasm of breast: Secondary | ICD-10-CM

## 2023-03-15 NOTE — Patient Instructions (Signed)
 Health screen labs Will call results Follow lifestyle medicine approach to help with headaches, see attached Return May Continue with neurology follow up Get dental appt see list of medicaid dentist

## 2023-03-16 DIAGNOSIS — Z1211 Encounter for screening for malignant neoplasm of colon: Secondary | ICD-10-CM | POA: Insufficient documentation

## 2023-03-16 DIAGNOSIS — G43909 Migraine, unspecified, not intractable, without status migrainosus: Secondary | ICD-10-CM | POA: Insufficient documentation

## 2023-03-16 DIAGNOSIS — R739 Hyperglycemia, unspecified: Secondary | ICD-10-CM | POA: Insufficient documentation

## 2023-03-16 LAB — CBC WITH DIFFERENTIAL/PLATELET
Basophils Absolute: 0.1 10*3/uL (ref 0.0–0.2)
Basos: 1 %
EOS (ABSOLUTE): 0.1 10*3/uL (ref 0.0–0.4)
Eos: 2 %
Hematocrit: 41.4 % (ref 34.0–46.6)
Hemoglobin: 12.8 g/dL (ref 11.1–15.9)
Immature Grans (Abs): 0 10*3/uL (ref 0.0–0.1)
Immature Granulocytes: 0 %
Lymphocytes Absolute: 2.6 10*3/uL (ref 0.7–3.1)
Lymphs: 37 %
MCH: 27.6 pg (ref 26.6–33.0)
MCHC: 30.9 g/dL — ABNORMAL LOW (ref 31.5–35.7)
MCV: 89 fL (ref 79–97)
Monocytes Absolute: 0.6 10*3/uL (ref 0.1–0.9)
Monocytes: 9 %
Neutrophils Absolute: 3.5 10*3/uL (ref 1.4–7.0)
Neutrophils: 51 %
Platelets: 261 10*3/uL (ref 150–450)
RBC: 4.64 x10E6/uL (ref 3.77–5.28)
RDW: 13.1 % (ref 11.7–15.4)
WBC: 6.8 10*3/uL (ref 3.4–10.8)

## 2023-03-16 LAB — ESTROGENS, TOTAL: Estrogen: 135 pg/mL

## 2023-03-16 LAB — COMPREHENSIVE METABOLIC PANEL
ALT: 14 [IU]/L (ref 0–32)
AST: 17 [IU]/L (ref 0–40)
Albumin: 4.5 g/dL (ref 3.9–4.9)
Alkaline Phosphatase: 75 [IU]/L (ref 44–121)
BUN/Creatinine Ratio: 14 (ref 9–23)
BUN: 9 mg/dL (ref 6–24)
Bilirubin Total: <0.2 mg/dL (ref 0.0–1.2)
CO2: 23 mmol/L (ref 20–29)
Calcium: 9.2 mg/dL (ref 8.7–10.2)
Chloride: 101 mmol/L (ref 96–106)
Creatinine, Ser: 0.66 mg/dL (ref 0.57–1.00)
Globulin, Total: 2.4 g/dL (ref 1.5–4.5)
Glucose: 95 mg/dL (ref 70–99)
Potassium: 5 mmol/L (ref 3.5–5.2)
Sodium: 138 mmol/L (ref 134–144)
Total Protein: 6.9 g/dL (ref 6.0–8.5)
eGFR: 109 mL/min/{1.73_m2} (ref >59)

## 2023-03-16 LAB — HEMOGLOBIN A1C
Est. average glucose Bld gHb Est-mCnc: 108 mg/dL
Hgb A1c MFr Bld: 5.4 % (ref 4.8–5.6)

## 2023-03-16 LAB — FSH/LH
FSH: 6.4 m[IU]/mL
LH: 4.3 m[IU]/mL

## 2023-03-16 LAB — LIPID PANEL
Chol/HDL Ratio: 3.7 {ratio} (ref 0.0–4.4)
Cholesterol, Total: 226 mg/dL — ABNORMAL HIGH (ref 100–199)
HDL: 61 mg/dL (ref >39)
LDL Chol Calc (NIH): 137 mg/dL — ABNORMAL HIGH (ref 0–99)
Triglycerides: 156 mg/dL — ABNORMAL HIGH (ref 0–149)
VLDL Cholesterol Cal: 28 mg/dL (ref 5–40)

## 2023-03-16 NOTE — Assessment & Plan Note (Signed)
 Chronic migraine headaches patient given a lifestyle medicine handout to improve the management of the headaches and care to be continued per neurology with Ajovy and Maxalt

## 2023-03-16 NOTE — Assessment & Plan Note (Signed)
 History of hyperglycemia will assess with hemoglobin A1c

## 2023-03-16 NOTE — Assessment & Plan Note (Signed)
 Uterine fibroids followed by gynecology patient does have menorrhagia however blood counts recently were normal

## 2023-03-16 NOTE — Assessment & Plan Note (Signed)
 Will screen with Cologuard

## 2023-03-16 NOTE — Assessment & Plan Note (Addendum)
 Recent mammogram normal she has a small nodular area in her right breast this will be monitored it is fibrocystic in nature recheck mammogram 1 year

## 2023-03-16 NOTE — Assessment & Plan Note (Signed)
As per gynecology 

## 2023-03-18 ENCOUNTER — Other Ambulatory Visit: Payer: Self-pay | Admitting: Critical Care Medicine

## 2023-03-18 MED ORDER — ATORVASTATIN CALCIUM 10 MG PO TABS: 10.0000 mg | ORAL_TABLET | Freq: Every day | ORAL | 3 refills | Status: DC

## 2023-03-18 NOTE — Progress Notes (Signed)
 Let pt know :  blood count normal  estrogen and female hormones normal, cholesterol is quite high I recommend low dose cholesterol pill daily sent to her pharmacy  liver kidney normal  no diabetes

## 2023-03-19 ENCOUNTER — Telehealth: Payer: Self-pay

## 2023-03-19 NOTE — Telephone Encounter (Signed)
 Pt was called and is aware of results, DOB was confirmed.  ?

## 2023-03-19 NOTE — Telephone Encounter (Signed)
-----   Message from Shan Levans sent at 03/18/2023  4:52 PM EST ----- Let pt know :  blood count normal  estrogen and female hormones normal, cholesterol is quite high I recommend low dose cholesterol pill daily sent to her pharmacy  liver kidney normal  no diabetes

## 2023-03-20 NOTE — Telephone Encounter (Signed)
 Called patient and left voicemail.

## 2023-03-21 ENCOUNTER — Telehealth: Payer: Self-pay | Admitting: Internal Medicine

## 2023-03-21 NOTE — Telephone Encounter (Signed)
 Copied from CRM [id number]. Topic: General - Call Back - No Documentation >> Mar 21, 2023  3:20 PM Fredrica W wrote: Reason for CRM: Patient returned missed call to [facility]. Called Cal Wenda Low is currently with patient. Read note as written from encounter. Patient understood. States she will start with some lifestyle changes and discuss next steps with provider at 3 month follow up. No further questions at this time. Thank You

## 2023-03-21 NOTE — Progress Notes (Signed)
 The patient attended a screening event on 02/23/23 where her screening results were BP 140/80 at 2nd retest and blood glucose was 139 mg/dl. Pt has PCP, insurance, and is not a smoker. No SDOHs were indicated at the event.   Per chart review pt is being seen by PCP as Dr. Burton Apley. Pt has Medicaid as her insurance. Chart review also indicates future appt with Dr. Huston Foley - Neurology [facility]) on 06/13/23 at 10:45 am. No future PCP appts. scheduled. Since event pt went to the ED on 03/02/23 and to see Dr. Shan Levans - Pulmonary [facility]) on 03/15/23. Dr. Delford Field met pt during 02/23/23 event per 03/15/23 visit notes. Visit notes also indicates that pt's bp and blood glucose were addressed during the visit. Chart review also indicates that on 03/19/23 pt's PCP office called to f/u with her relating to her lab results from her 03/15/23 visit, they left a vm. On 03/21/23, pt returned call to PCP office, lab results were shared, and she will discuss next steps with provider at her 3 month f/u with them.  Called pt to verify PCP status. Pt shares that due to connecting with Dr. Delford Field at the 02/23/23 event she was able to establish new care with him and is no longer seeing Dr. Su Hilt. Pt's chart has been updated to reflect this.   No additional Health equity team support indicated at this time.

## 2023-03-21 NOTE — Telephone Encounter (Signed)
 Noted.

## 2023-04-14 LAB — COLOGUARD: COLOGUARD: NEGATIVE

## 2023-04-16 ENCOUNTER — Encounter: Payer: Self-pay | Admitting: Physician Assistant

## 2023-05-10 ENCOUNTER — Other Ambulatory Visit: Payer: Self-pay | Admitting: Medical Genetics

## 2023-05-11 ENCOUNTER — Other Ambulatory Visit (HOSPITAL_COMMUNITY)

## 2023-05-11 ENCOUNTER — Telehealth: Payer: Self-pay

## 2023-05-11 ENCOUNTER — Other Ambulatory Visit (HOSPITAL_COMMUNITY): Payer: Self-pay

## 2023-05-11 NOTE — Telephone Encounter (Signed)
 It is time to renew the PA for Ajovy , PT has not been evaluated since starting Ajovy -Insurance requires documentation as to if the medication is helping the PT with her migrain frequency and severity. Please see below-(I answered yes just to get all of the clinical questions to populate). Please advise-

## 2023-05-14 NOTE — Telephone Encounter (Signed)
 Noted-will archive CMM.

## 2023-05-14 NOTE — Telephone Encounter (Signed)
 Pt has a f/u visit in just a few weeks, 06/13/23. Can submit to insurance right after.

## 2023-05-16 ENCOUNTER — Encounter: Payer: Self-pay | Admitting: Obstetrics and Gynecology

## 2023-05-16 ENCOUNTER — Other Ambulatory Visit (HOSPITAL_COMMUNITY)
Admission: RE | Admit: 2023-05-16 | Discharge: 2023-05-16 | Disposition: A | Discharge status: Home / Self Care | Source: Ambulatory Visit | Attending: Obstetrics and Gynecology | Admitting: Obstetrics and Gynecology

## 2023-05-16 ENCOUNTER — Ambulatory Visit: Admitting: Obstetrics and Gynecology

## 2023-05-16 VITALS — BP 131/79 | HR 101 | Ht 65.0 in | Wt 132.0 lb

## 2023-05-16 DIAGNOSIS — N920 Excessive and frequent menstruation with regular cycle: Secondary | ICD-10-CM | POA: Diagnosis not present

## 2023-05-16 DIAGNOSIS — D259 Leiomyoma of uterus, unspecified: Secondary | ICD-10-CM | POA: Diagnosis not present

## 2023-05-16 DIAGNOSIS — Z1151 Encounter for screening for human papillomavirus (HPV): Secondary | ICD-10-CM | POA: Insufficient documentation

## 2023-05-16 DIAGNOSIS — Z113 Encounter for screening for infections with a predominantly sexual mode of transmission: Secondary | ICD-10-CM

## 2023-05-16 DIAGNOSIS — R87810 Cervical high risk human papillomavirus (HPV) DNA test positive: Secondary | ICD-10-CM | POA: Diagnosis not present

## 2023-05-16 DIAGNOSIS — Z124 Encounter for screening for malignant neoplasm of cervix: Secondary | ICD-10-CM | POA: Diagnosis not present

## 2023-05-16 DIAGNOSIS — Z01419 Encounter for gynecological examination (general) (routine) without abnormal findings: Secondary | ICD-10-CM | POA: Diagnosis present

## 2023-05-16 NOTE — Progress Notes (Signed)
 Pt had fibroid embolization last Oct and would like new u/s to see if fibroids have changed. Pt has been having heavy cycles since.  Pt would like to discuss BC options given hx of fibroids and tx.  Pt would like all testing with exam today. Mammo up to date - 02/2023.

## 2023-05-17 ENCOUNTER — Encounter: Payer: Self-pay | Admitting: Obstetrics and Gynecology

## 2023-05-17 ENCOUNTER — Other Ambulatory Visit: Payer: Self-pay | Admitting: Interventional Radiology

## 2023-05-17 DIAGNOSIS — D251 Intramural leiomyoma of uterus: Secondary | ICD-10-CM

## 2023-05-17 LAB — HIV ANTIBODY (ROUTINE TESTING W REFLEX): HIV Screen 4th Generation wRfx: NONREACTIVE

## 2023-05-17 LAB — CERVICOVAGINAL ANCILLARY ONLY
Chlamydia: NEGATIVE
Comment: NEGATIVE
Comment: NEGATIVE
Comment: NORMAL
Neisseria Gonorrhea: NEGATIVE
Trichomonas: NEGATIVE

## 2023-05-17 LAB — HEPATITIS B SURFACE ANTIGEN: Hepatitis B Surface Ag: NEGATIVE

## 2023-05-17 LAB — RPR: RPR Ser Ql: NONREACTIVE

## 2023-05-17 LAB — HEPATITIS C ANTIBODY: Hep C Virus Ab: NONREACTIVE

## 2023-05-17 NOTE — Progress Notes (Signed)
 GYNECOLOGY ANNUAL PREVENTATIVE CARE ENCOUNTER NOTE  History:     Catherine Estrada is a 48 y.o. [id number] female here for a routine annual gynecologic exam.  Current complaints: continued heavy menstrual bleeding, but slightly improved since UFE 10/24.  Pt has continued to use lysteda  intermittently, but seems to use less since the UFE procedure.   Gynecologic History Patient's last menstrual period was 05/09/2023. Contraception: none Last Pap: 11/22. Results were: normal with negative HPV Last mammogram: 2/25. Results were: normal  Obstetric History OB History  Gravida Para Term Preterm AB Living  3 2 2  0 1 2  SAB IAB Ectopic Multiple Live Births  1 0 0 0 2    # Outcome Date GA Lbr Len/2nd Weight Sex Type Anes PTL Lv  3 Term 08/25/08    M Vag-Spont EPI N LIV  2 SAB 2010     SAB     1 Term 03/27/06    F Vag-Spont EPI  LIV    Past Medical History:  Diagnosis Date   Asthma 2014   Fibroid    Gastritis    History of bilateral breast implants    summer 2022   Migraine     Past Surgical History:  Procedure Laterality Date   IR ANGIOGRAM PELVIS SELECTIVE OR SUPRASELECTIVE  11/10/2022   IR ANGIOGRAM SELECTIVE EACH ADDITIONAL VESSEL  11/10/2022   IR EMBO TUMOR ORGAN ISCHEMIA INFARCT INC GUIDE ROADMAPPING  11/10/2022   IR US  GUIDE VASC ACCESS RIGHT  11/10/2022   VAGINOPLASTY  2012   had "vaginal tightening" after last delivery    Current Outpatient Medications on File Prior to Visit  Medication Sig Dispense Refill   Fremanezumab -vfrm (AJOVY ) 225 MG/1.5ML SOAJ Inject 225 mg into the skin every 30 (thirty) days. 1.5 mL 3   rizatriptan  (MAXALT ) 5 MG tablet Take 1 tablet (5 mg total) by mouth as needed for migraine. May repeat in 2 h prn, no more than 2 pills/24 h, no more than 3 pills/week. 10 tablet 1   tranexamic acid  (LYSTEDA ) 650 MG TABS tablet Take 2 tablets (1,300 mg total) by mouth 3 (three) times daily. Take during menses for a maximum of five days 30 tablet 8    albuterol  (PROVENTIL  HFA;VENTOLIN  HFA) 108 (90 Base) MCG/ACT inhaler Inhale 2 puffs into the lungs every 6 (six) hours as needed for wheezing or shortness of breath.     ascorbic acid (VITAMIN C) 500 MG tablet Take 500 mg by mouth daily.     aspirin -acetaminophen -caffeine  (EXCEDRIN  MIGRAINE) 250-250-65 MG tablet Take 1 tablet by mouth every 6 (six) hours as needed for headache.     Multiple Vitamin (MULTIVITAMIN) tablet Take 1 tablet by mouth daily.     OVER THE COUNTER MEDICATION NUTRAFOL take 1 tablet daily     No current facility-administered medications on file prior to visit.    No Known Allergies  Social History:  reports that she has never smoked. She has never used smokeless tobacco. She reports current alcohol use. She reports that she does not use drugs.  Family History  Problem Relation Age of Onset   Hypertension Mother    Hypertension Father    Heart block Sister    Breast cancer Maternal Aunt    Migraines Paternal Aunt    Breast cancer Paternal Aunt 82   Heart block Maternal Grandmother    Heart block Paternal Grandmother     The following portions of the patient's history were reviewed and updated as  appropriate: allergies, current medications, past family history, past medical history, past social history, past surgical history and problem list.  Review of Systems Pertinent items noted in HPI and remainder of comprehensive ROS otherwise negative.  Physical Exam:  BP 131/79   Pulse (!) 101   Ht 5\' 5"  (1.651 m)   Wt 132 lb (59.9 kg)   LMP 05/09/2023   BMI 21.97 kg/m  CONSTITUTIONAL: Well-developed, well-nourished female in no acute distress.  HENT:  Normocephalic, atraumatic, External right and left ear normal. Oropharynx is clear and moist EYES: Conjunctivae and EOM are normal.  NECK: Normal range of motion, supple, no masses.  Normal thyroid.  SKIN: Skin is warm and dry. No rash noted. Not diaphoretic. No erythema. No pallor. MUSCULOSKELETAL: Normal range of  motion. No tenderness.  No cyanosis, clubbing, or edema.  2+ distal pulses. NEUROLOGIC: Alert and oriented to person, place, and time. Normal reflexes, muscle tone coordination.  PSYCHIATRIC: Normal mood and affect. Normal behavior. Normal judgment and thought content. CARDIOVASCULAR: Normal heart rate noted, regular rhythm RESPIRATORY: Clear to auscultation bilaterally. Effort and breath sounds normal, no problems with respiration noted. BREASTS: deferred ABDOMEN: Soft, no distention noted.  No tenderness, rebound or guarding.  PELVIC: Normal appearing external genitalia and urethral meatus; normal appearing vaginal mucosa and cervix.  No abnormal discharge noted.  Pap smear obtained. Vaginal swab taken.  Slightly enlarged uterine size,possibly smaller than before UFE. No other palpable masses, no uterine or adnexal tenderness.  Performed in the presence of a chaperone.   Assessment and Plan:    1. Women's annual routine gynecological examination (Primary) Normal annual exam - Cytology - PAP( [place])  2. Uterine leiomyoma, unspecified location Pt s/p UFE.  Pt has not had follow up with interventional radiology.  Pt advised to call and possibly get follow up MRI.  If she is unable to get back with IR, will order MRI or pelvic ultrasound per pt's preference.  3. Menorrhagia with regular cycle Pt still has increased bleeding. She desires OCP, but is using lysteda .  Pt informed she would need to choose between the two.  Lysteda  and OCP are contraindicated together. If there is not continued improvement of bleeding over time, will need to consider definitive treatment with hysterectomy. She would prefer laparoscopic or robotic hyst.  4. Routine screening for STI (sexually transmitted infection) Per pt request - Cervicovaginal ancillary only( [place]) - HIV Antibody (routine testing w rflx) - Hepatitis B surface antigen - [facility] - Hepatitis C antibody  Will follow up results of pap  smear and manage accordingly. Routine preventative health maintenance measures emphasized. Please refer to After Visit Summary for other counseling recommendations.      Avie Boeck, MD, FACOG Obstetrician & Gynecologist, [facility] for [facility], [facility]

## 2023-05-18 ENCOUNTER — Other Ambulatory Visit (HOSPITAL_COMMUNITY)
Admission: RE | Admit: 2023-05-18 | Discharge: 2023-05-18 | Disposition: A | Discharge status: Home / Self Care | Payer: Self-pay | Source: Ambulatory Visit | Attending: Medical Genetics | Admitting: Medical Genetics

## 2023-05-21 LAB — CYTOLOGY - PAP
Comment: NEGATIVE
Comment: NEGATIVE
Comment: NEGATIVE
Diagnosis: NEGATIVE
Diagnosis: REACTIVE
HPV 16: NEGATIVE
HPV 18 / 45: NEGATIVE
High risk HPV: POSITIVE — AB

## 2023-05-27 LAB — GENECONNECT MOLECULAR SCREEN: Genetic Analysis Overall Interpretation: NEGATIVE

## 2023-06-01 ENCOUNTER — Ambulatory Visit
Admission: RE | Admit: 2023-06-01 | Discharge: 2023-06-01 | Disposition: A | Discharge status: Home / Self Care | Source: Ambulatory Visit | Attending: Interventional Radiology | Admitting: Interventional Radiology

## 2023-06-01 DIAGNOSIS — D251 Intramural leiomyoma of uterus: Secondary | ICD-10-CM

## 2023-06-01 MED ORDER — GADOPICLENOL 0.5 MMOL/ML IV SOLN
6.0000 mL | Freq: Once | INTRAVENOUS | Status: AC | PRN
  Administered 2023-06-01: 6 mL via INTRAVENOUS

## 2023-06-04 ENCOUNTER — Encounter: Payer: Self-pay | Admitting: Neurology

## 2023-06-04 DIAGNOSIS — G4440 Drug-induced headache, not elsewhere classified, not intractable: Secondary | ICD-10-CM

## 2023-06-04 DIAGNOSIS — Z9189 Other specified personal risk factors, not elsewhere classified: Secondary | ICD-10-CM

## 2023-06-04 DIAGNOSIS — G43019 Migraine without aura, intractable, without status migrainosus: Secondary | ICD-10-CM

## 2023-06-04 DIAGNOSIS — R519 Headache, unspecified: Secondary | ICD-10-CM

## 2023-06-04 MED ORDER — RIZATRIPTAN BENZOATE 5 MG PO TABS: 5.0000 mg | ORAL_TABLET | ORAL | 1 refills | Status: DC | PRN

## 2023-06-06 ENCOUNTER — Ambulatory Visit
Admission: RE | Admit: 2023-06-06 | Discharge: 2023-06-06 | Disposition: A | Discharge status: Home / Self Care | Source: Ambulatory Visit | Attending: Interventional Radiology | Admitting: Interventional Radiology

## 2023-06-06 DIAGNOSIS — D251 Intramural leiomyoma of uterus: Secondary | ICD-10-CM

## 2023-06-13 ENCOUNTER — Other Ambulatory Visit (HOSPITAL_COMMUNITY): Payer: Self-pay

## 2023-06-13 ENCOUNTER — Telehealth: Payer: Self-pay

## 2023-06-13 ENCOUNTER — Ambulatory Visit: Payer: Medicaid Other | Admitting: Neurology

## 2023-06-13 ENCOUNTER — Encounter: Payer: Self-pay | Admitting: Neurology

## 2023-06-13 VITALS — BP 132/84 | HR 79 | Ht 65.0 in | Wt 135.0 lb

## 2023-06-13 DIAGNOSIS — Z9189 Other specified personal risk factors, not elsewhere classified: Secondary | ICD-10-CM | POA: Diagnosis not present

## 2023-06-13 DIAGNOSIS — G43019 Migraine without aura, intractable, without status migrainosus: Secondary | ICD-10-CM

## 2023-06-13 DIAGNOSIS — G4440 Drug-induced headache, not elsewhere classified, not intractable: Secondary | ICD-10-CM | POA: Diagnosis not present

## 2023-06-13 DIAGNOSIS — R519 Headache, unspecified: Secondary | ICD-10-CM

## 2023-06-13 MED ORDER — NURTEC 75 MG PO TBDP: 75.0000 mg | ORAL_TABLET | ORAL | 0 refills | Status: DC

## 2023-06-13 MED ORDER — RIZATRIPTAN BENZOATE 10 MG PO TABS: 10.0000 mg | ORAL_TABLET | ORAL | 5 refills | Status: AC | PRN

## 2023-06-13 NOTE — Telephone Encounter (Signed)
 Pharmacy Patient Advocate Encounter   Received notification from CoverMyMeds that prior authorization for Nurtec 75MG  dispersible tablets is required/requested.   Insurance verification completed.   The patient is insured through [facility] .   Per test claim: PA required; PA submitted to above mentioned insurance via CoverMyMeds Key/confirmation #/EOC [id number] Status is pending

## 2023-06-13 NOTE — Progress Notes (Addendum)
 Subjective:    Patient ID: Catherine Estrada is a 48 y.o. female.  HPI    Interim history:   Catherine Estrada is a 48 year old female with an underlying medical history of asthma, fibroids, overactive bladder, and history of gastritis, who presents for follow-up consultation of her recurrent headaches.  The patient is unaccompanied today.  I first met her at the request of the emergency room on 03/06/2023, at which time she reported a longstanding history of.  She was advised to proceed with headache prevention with Nurtec and as needed use of Maxalt  for acute headaches.  Her Nurtec was denied by her insurance and she was advised to start Ajovy  injections monthly.  We talked about pursuing a home sleep test and a brain MRI.  Today, 06/13/2023: She reports no significant improvement in her migraines.  Her migraines are worse before her menstrual cycle.  She has been on tranexamic acid  tablets to reduce the severity of her bleeding.  She had an embolization last year but it did not really help.  She may be looking at needing a hysterectomy next.  She has heavy bleeding and painful fibroids and has been working closely with her GYN.  She has not made a follow-up appointment yet.  She would like to try Nurtec.  She is limiting her Maxalt  but would be willing to increase the dose.  She has limited her Excedrin  and has used it about 3 times this past month.  She limits her caffeine  to 1 cup of coffee in the morning and tries to hydrate well but admits that she hydrates better after she comes home from work.  Her fibroids make her urinary urgency worse when she hydrates better.  She does not have unlimited access to a bathroom though during her workday.  She has new eyeglasses.  She would be willing to pursue a home sleep test.  She wonders if her oxygen drops in her sleep.  Sometimes when she wakes up with a headache she has to sit up and lying down makes it worse. Ajovy  injections caused injection site reaction, the  third time she had blisters and a rash and itching.  She did not call our office unfortunately.  She has not taken her fourth dose.  The patient's allergies, current medications, family history, past medical history, past social history, past surgical history and problem list were reviewed and updated as appropriate.   Previously:  03/06/2023: (She) reports a longstanding history of recurrent headaches for many years.  She reports typically bifrontal headaches and sometimes the headaches are on the top of her head.  She is currently using over-the-counter medication, particularly Excedrin  Migraine, 2 to 3 pills every day on average.  She has had a recurrent or persistent headache for the past 4 weeks.  She has been on Excedrin  Migraine for the past 4 weeks.  She tries to hydrate well with water and estimates that she drinks about 4 bottles of water per day.  She drinks caffeine  in the form of coffee, usually 1 cup/day.  She drinks alcohol rarely, on special occasions.  She is a non-smoker.  She is divorced and lives with her 2 children, ages 81 and 60.  She works part-time as an Equities trader.  She has a family history of migraines affecting her paternal aunt.  Her PCP prescribed medications for headache prevention including amitriptyline which she tried about 5 months ago, unclear how long, it was 25 mg strength but made her too sleepy.  She  also took topiramate about a year ago for about a month but it made her too sleepy even though she took it only at night.  Tramadol also cause side effects.  She denies any sudden onset of one-sided weakness or numbness or tingling or droopy face or slurring of speech.  She is not aware of any snoring, she has woken up in the middle of the night and first thing in the morning with headaches.  She goes to bed around 10 and rise time is around 6.  She has never had a sleep study.  She had an eye examination yesterday with a dilated eye exam and needs updated prescription  eyeglasses which she will likely get next week.   She has never been on a triptan.  Her asthma is generally under good control.   She presented to the emergency room at [facility] on 03/02/2023 with a 3-week history of recurrent headaches associated with lightheadedness.  I reviewed the emergency room records.  Laboratory testing included CBC, BMP and urinalysis with benign results.  She had a head CT without contrast on 03/02/2023 and I reviewed the results:  IMPRESSION: No CT etiology for headaches identified   In addition, I personally reviewed images through the PACS system.    She was treated symptomatically with Toradol .    Her Past Medical History Is Significant For: Past Medical History:  Diagnosis Date   Asthma 2014   Fibroid    Gastritis    History of bilateral breast implants    summer 2022   Migraine     Her Past Surgical History Is Significant For: Past Surgical History:  Procedure Laterality Date   IR ANGIOGRAM PELVIS SELECTIVE OR SUPRASELECTIVE  11/10/2022   IR ANGIOGRAM SELECTIVE EACH ADDITIONAL VESSEL  11/10/2022   IR EMBO TUMOR ORGAN ISCHEMIA INFARCT INC GUIDE ROADMAPPING  11/10/2022   IR US  GUIDE VASC ACCESS RIGHT  11/10/2022   VAGINOPLASTY  2012   had vaginal tightening after last delivery    Her Family History Is Significant For: Family History  Problem Relation Age of Onset   Hypertension Mother    Hypertension Father    Heart block Sister    Breast cancer Maternal Aunt    Migraines Paternal Aunt    Breast cancer Paternal Aunt 39   Heart block Maternal Grandmother    Heart block Paternal Grandmother     Her Social History Is Significant For: Social History   Socioeconomic History   Marital status: Legally Separated    Spouse name: Not on file   Number of children: 2   Years of education: Not on file   Highest education level: Bachelor's degree (e.g., BA, AB, BS)  Occupational History   Not on file  Tobacco Use    Smoking status: Never   Smokeless tobacco: Never  Vaping Use   Vaping status: Never Used  Substance and Sexual Activity   Alcohol use: Yes    Comment: Rare   Drug use: No   Sexual activity: Yes    Partners: Male    Birth control/protection: None  Other Topics Concern   Not on file  Social History Narrative   Caffiene 1 cup coffee daily   Works interpreter, (daytime hrs)   Live 2 kids. 1 dog.     Social Drivers of Corporate investment banker Strain: Low Risk  (03/15/2023)   Overall Financial Resource Strain (CARDIA)    Difficulty of Paying Living Expenses: Not very hard  Food Insecurity: No Food Insecurity (03/15/2023)   Hunger Vital Sign    Worried About Running Out of Food in the Last Year: Never true    Ran Out of Food in the Last Year: Never true  Transportation Needs: No Transportation Needs (03/15/2023)   PRAPARE - Administrator, Civil Service (Medical): No    Lack of Transportation (Non-Medical): No  Physical Activity: Unknown (03/15/2023)   Exercise Vital Sign    Days of Exercise per Week: 0 days    Minutes of Exercise per Session: Not on file  Stress: No Stress Concern Present (03/15/2023)   [employer] of Occupational Health - Occupational Stress Questionnaire    Feeling of Stress : Not at all  Social Connections: Moderately Isolated (03/15/2023)   Social Connection and Isolation Panel [NHANES]    Frequency of Communication with Friends and Family: More than three times a week    Frequency of Social Gatherings with Friends and Family: Once a week    Attends Religious Services: 1 to 4 times per year    Active Member of [employer] or Organizations: No    Attends Engineer, structural: Not on file    Marital Status: Separated    Her Allergies Are:  No Known Allergies:   Her Current Medications Are:  Outpatient Encounter Medications as of 06/13/2023  Medication Sig   albuterol  (PROVENTIL  HFA;VENTOLIN  HFA) 108 (90 Base) MCG/ACT inhaler Inhale 2  puffs into the lungs every 6 (six) hours as needed for wheezing or shortness of breath.   ascorbic acid (VITAMIN C) 500 MG tablet Take 500 mg by mouth daily.   aspirin -acetaminophen -caffeine  (EXCEDRIN  MIGRAINE) 250-250-65 MG tablet Take 1 tablet by mouth every 6 (six) hours as needed for headache.   Multiple Vitamin (MULTIVITAMIN) tablet Take 1 tablet by mouth daily.   OVER THE COUNTER MEDICATION NUTRAFOL take 1 tablet daily   rizatriptan  (MAXALT ) 5 MG tablet Take 1 tablet (5 mg total) by mouth as needed for migraine. May repeat in 2 h prn, no more than 2 pills/24 h, no more than 3 pills/week.   tranexamic acid  (LYSTEDA ) 650 MG TABS tablet Take 2 tablets (1,300 mg total) by mouth 3 (three) times daily. Take during menses for a maximum of five days   Fremanezumab -vfrm (AJOVY ) 225 MG/1.5ML SOAJ Inject 225 mg into the skin every 30 (thirty) days. (Patient not taking: Reported on 06/13/2023)   No facility-administered encounter medications on file as of 06/13/2023.  :  Review of Systems:  Out of a complete 14 point review of systems, all are reviewed and negative with the exception of these symptoms as listed below:   Review of Systems  Neurological:        Pt here for Migraine f/u  Pt states migraines not better Pt states allergic reaction to Ajovy   Pt states Migraines increase during menstrual cycle      Objective:  Neurological Exam  Physical Exam Physical Examination:   Vitals:   06/13/23 1045  BP: 132/84  Pulse: 79    General Examination: The patient is a very pleasant 48 y.o. female in no acute distress. She appears well-developed and well-nourished and well groomed.   HEENT: Normocephalic, atraumatic, pupils are equal, round and reactive to light, no photophobia.  Extraocular tracking is good without limitation to gaze excursion or nystagmus noted.  Corrective eyeglasses in place.  Hearing is grossly intact. Face is symmetric with normal facial animation and normal facial sensation  to light touch. Speech  is clear with no dysarthria noted. There is no hypophonia. There is no lip, neck/head, jaw or voice tremor. Neck is supple with full range of passive and active motion. There are no carotid bruits on auscultation. Oropharynx exam reveals: mild mouth dryness, good dental hygiene and mild airway crowding.   Chest: Clear to auscultation without wheezing, rhonchi or crackles noted.   Heart: S1+S2+0, regular and normal without murmurs, rubs or gallops noted.    Abdomen: Soft, non-tender and non-distended.   Extremities: There is no pitting edema in the distal lower extremities bilaterally.    Skin: Warm and dry without trophic changes noted.    Musculoskeletal: exam reveals no obvious joint deformities.    Neurologically:  Mental status: The patient is awake, alert and oriented in all 4 spheres. Her immediate and remote memory, attention, language skills and fund of knowledge are appropriate. There is no evidence of aphasia, agnosia, apraxia or anomia. Speech is clear with normal prosody and enunciation. Thought process is linear. Mood is normal and affect is normal.  Cranial nerves II - XII are as described above under HEENT exam.  Motor exam: Normal bulk, strength and tone is noted. There is no obvious action or resting tremor.  No drift or rebound. Romberg negative. Reflexes 1-2+ throughout, toes are downgoing bilaterally. Fine motor skills and coordination: Grossly intact.    Cerebellar testing: No dysmetria or intention tremor. There is no truncal or gait ataxia.   Sensory exam: intact to light touch.  Gait, station and balance: She stands easily. No veering to one side is noted. No leaning to one side is noted. Posture is age-appropriate and stance is narrow based. Gait shows normal stride length and normal pace. No problems turning are noted.  Normal tandem walk.   Assessment and Plan:    In summary, Catherine Estrada is a very pleasant 48 year old female with an  underlying medical history of asthma, fibroids, overactive bladder, and history of gastritis, who presents for follow-up consultation of her recurrent headaches, particularly migraine headaches.  She had a reaction which was confined to a local reaction with Ajovy  injections, she has since then stopped her injections after 3 months.  She is reminded to always get in touch with us  if she has a reaction to any of our prescriptions.   She has been using Maxalt  as needed with relatively good success, I suggest we increase the dose to 10 mg strength at this point.   We will proceed with a home sleep test, I probably failed to order the test at the last appointment and I apologized for the delay.    I do not see a pressing reason to proceed with a brain MRI at this time.    She has updated eyeglasses.    We talked about headache triggers again today.  She is reminded to avoid Excedrin  Migraine and use Advil  as needed, not to overuse her triptan.  She is advised to proceed with Nurtec every other day for migraine prevention.  At this juncture, would like to avoid another injectable in close proximity to her Ajovy  injections because of her reaction.    Previously tried medications include amitriptyline and topiramate with side effects reported and tramadol for as needed use, which also caused side effects.   We will keep her posted as to her home sleep test results by phone call.  We may consider treatment with an AutoPap machine if she has sleep apnea.  She is advised to follow-up  in this clinic in about 6 months to see one of our nurse practitioners routinely.  I answered all her questions today and she was in agreement.  I spent 30 minutes in total face-to-face time and in reviewing records during pre-charting, more than 50% of which was spent in counseling and coordination of care, reviewing test results, reviewing medications and treatment regimen and/or in discussing or reviewing the diagnosis of  migraine headaches, recurrent headaches, concern for sleep apnea, the prognosis and treatment options. Pertinent laboratory and imaging test results that were available during this visit with the patient were reviewed by me and considered in my medical decision making (see chart for details).   Addendum, 07/25/2023: Please note that the patient has had multiple injections thus far and had significant injection site reactions to both Ajovy  and Emgality  at this point.  She is advised to discontinue injectable headache prevention treatment at this time and I recommend that she start taking Nurtec every other day for migraine prevention.  She has also tried other oral migraine preventatives before, see above.

## 2023-06-14 ENCOUNTER — Ambulatory Visit: Payer: Medicaid Other | Admitting: Neurology

## 2023-06-15 NOTE — Telephone Encounter (Signed)
 Pharmacy Patient Advocate Encounter  Received notification from [facility] that Prior Authorization for Nurtec 75MG  dispersible tablets has been DENIED.  Full denial letter will be uploaded to the media tab. See denial reason below.   PA #/Case ID/Reference #: PA Case ID #: [id number]

## 2023-06-18 NOTE — Telephone Encounter (Signed)
Pt called wanting to know what the update is on this medication. Please advise.

## 2023-06-18 NOTE — Telephone Encounter (Signed)
 I sent the patient a mychart message. We will need to discuss the denial with Dr Omar Bibber.

## 2023-06-19 MED ORDER — EMGALITY 120 MG/ML ~~LOC~~ SOAJ: 120.0000 mg | SUBCUTANEOUS | 5 refills | Status: DC

## 2023-06-19 NOTE — Telephone Encounter (Signed)
 We had talked about this a little bit during the appointment, if she is agreeable, I would suggest we try Emgality.  Once you talk to the patient or have word back from her through MyChart, please place order for Emgality [place] inj. to her preferred pharmacy.

## 2023-06-19 NOTE — Addendum Note (Signed)
 Addended by: Burns Carwin on: 06/19/2023 04:15 PM   Modules accepted: Orders

## 2023-06-20 ENCOUNTER — Telehealth: Payer: Self-pay | Admitting: Pharmacist

## 2023-06-20 NOTE — Telephone Encounter (Signed)
 Pharmacy Patient Advocate Encounter  Received notification from [facility] that Prior Authorization for Emgality 120MG /ML auto-injectors (migraine) has been APPROVED from 06/20/2023 to 09/18/2023   PA #/Case ID/Reference #: [id number]

## 2023-06-20 NOTE — Telephone Encounter (Signed)
 Pharmacy Patient Advocate Encounter   Received notification from Patient Pharmacy that prior authorization for Emgality 120MG /ML auto-injectors (migraine) is required/requested.   Insurance verification completed.   The patient is insured through [facility] .   Per test claim: PA required; PA submitted to above mentioned insurance via CoverMyMeds Key/confirmation #/EOC [id number] Status is pending

## 2023-06-22 ENCOUNTER — Telehealth: Payer: Self-pay | Admitting: Neurology

## 2023-06-22 NOTE — Telephone Encounter (Signed)
 [facility] pending

## 2023-06-27 NOTE — Telephone Encounter (Signed)
[facility]- [facility] [employer] no auth req via fax form

## 2023-06-28 ENCOUNTER — Ambulatory Visit: Admitting: Obstetrics and Gynecology

## 2023-06-28 ENCOUNTER — Other Ambulatory Visit (HOSPITAL_COMMUNITY)
Admission: RE | Admit: 2023-06-28 | Discharge: 2023-06-28 | Disposition: A | Discharge status: Home / Self Care | Source: Ambulatory Visit | Attending: Obstetrics and Gynecology | Admitting: Obstetrics and Gynecology

## 2023-06-28 VITALS — BP 132/82 | HR 89 | Wt 131.0 lb

## 2023-06-28 DIAGNOSIS — R87610 Atypical squamous cells of undetermined significance on cytologic smear of cervix (ASC-US): Secondary | ICD-10-CM | POA: Diagnosis present

## 2023-06-28 DIAGNOSIS — R87810 Cervical high risk human papillomavirus (HPV) DNA test positive: Secondary | ICD-10-CM | POA: Diagnosis not present

## 2023-06-28 NOTE — Progress Notes (Signed)
    GYNECOLOGY CLINIC COLPOSCOPY PROCEDURE NOTE  48 y.o. [id number] here for colposcopy for pap finding of:  Result Date Procedure Results Follow-ups  05/16/2023 Cytology - PAP( [place]) High risk HPV: Positive (A) HPV 16: Negative HPV 18 / 45: Negative Adequacy: Satisfactory for evaluation; transformation zone component PRESENT. Diagnosis: - Negative for Intraepithelial Lesions or Malignancy (NILM) Diagnosis: - Benign reactive/reparative changes Comment: Normal Reference Range HPV - Negative Comment: Normal Reference Range HPV 16- Negative Comment: Normal Reference Range HPV 16 18 45 -Negative   12/15/2020 Cytology - PAP( [place]) High risk HPV: Negative Adequacy: Satisfactory for evaluation; transformation zone component PRESENT. Diagnosis: - Negative for Intraepithelial Lesions or Malignancy (NILM) Diagnosis: - Benign reactive/reparative changes Comment: Normal Reference Range HPV - Negative   10/31/2019 Surgical pathology( [place]/ [facility]) SURGICAL PATHOLOGY: SURGICAL PATHOLOGY CASE: [id number] PATIENT: Sherree Doctor Surgical Pathology Report     Clinical History: LGSIL (cm)     FINAL MICROSCOPIC DIAGNOSIS:  A. CERVIX, BIOPSY: - Low-grade squamous intraepithelial lesion (CIN1, low grade dys...   07/28/2019 Cytology - PAP( [place]) High risk HPV: Negative Adequacy: Satisfactory for evaluation; transformation zone component PRESENT. Diagnosis: - Low grade squamous intraepithelial lesion (LSIL) (A) Comment: Normal Reference Range HPV - Negative   07/24/2018 Cytology - PAP( [place]) Adequacy: Satisfactory for evaluation  endocervical/transformation zone component PRESENT. Diagnosis: NEGATIVE FOR INTRAEPITHELIAL LESIONS OR MALIGNANCY. Diagnosis: FUNGAL ORGANISMS PRESENT CONSISTENT WITH CANDIDA SPP. HPV: NOT DETECTED Material Submitted: CervicoVaginal Pap [ThinPrep Imaged]   11/16/2016 Cytology - PAP Adequacy: Satisfactory for evaluation   endocervical/transformation zone component PRESENT. Diagnosis: NEGATIVE FOR INTRAEPITHELIAL LESIONS OR MALIGNANCY. HPV: NOT DETECTED Chlamydia: Negative Neisseria Gonorrhea: Negative Material Submitted: CervicoVaginal Pap [ThinPrep Imaged] CYTOLOGY - PAP: PAP RESULT     Discussed role for HPV in cervical dysplasia, need for surveillance, nature of the procedure, and risks and benefits. Explained current guidelines would allow for continued surveillance with repap in 6-12 months.  Pt desired more aggressive evaluation. Pregnancy test: Lab Results  Component Value Date   PREGTESTUR Negative 12/15/2020    No Known Allergies  Patient given informed consent, signed copy in the chart, time out was performed.    Placed in lithotomy position. Cervix viewed with speculum and colposcope after application of acetic acid and lugol's solution.   Colposcopy Adequacy Cervix fully visualized: Yes  SCJ fully visualized: No    Colposcopy Findings no visible lesions, no mosaicism, and no punctation  Corresponding biopsies were not obtained.    ECC specimen was obtained.  All specimens were labeled and sent to pathology.  Hemostatic measures: None  Complications: none  Patient tolerated the procedure well.  OBGyn Exam  Colposcopy Impressions Normal  Plan Treatment plan pending biopsy results, per patient preference they will be communicated by MD/staff.  Patient was given post procedure instructions.  Will follow up pathology and manage accordingly; patient will be contacted with results and recommendations.  Routine preventative health maintenance measures emphasized.  Per pt MRI confirmed that UFE was not successful.  She is considering hysterectomy at this time due to continued irregular bleeding.  Abigail Abler, MD

## 2023-07-02 LAB — SURGICAL PATHOLOGY

## 2023-07-06 ENCOUNTER — Ambulatory Visit: Payer: Self-pay | Admitting: Obstetrics and Gynecology

## 2023-07-06 ENCOUNTER — Ambulatory Visit (INDEPENDENT_AMBULATORY_CARE_PROVIDER_SITE_OTHER): Admitting: Neurology

## 2023-07-06 DIAGNOSIS — Z9189 Other specified personal risk factors, not elsewhere classified: Secondary | ICD-10-CM

## 2023-07-06 DIAGNOSIS — R0683 Snoring: Secondary | ICD-10-CM

## 2023-07-06 DIAGNOSIS — G4733 Obstructive sleep apnea (adult) (pediatric): Secondary | ICD-10-CM | POA: Diagnosis not present

## 2023-07-06 DIAGNOSIS — R519 Headache, unspecified: Secondary | ICD-10-CM

## 2023-07-06 DIAGNOSIS — G43019 Migraine without aura, intractable, without status migrainosus: Secondary | ICD-10-CM

## 2023-07-24 ENCOUNTER — Encounter: Payer: Self-pay | Admitting: Neurology

## 2023-07-25 ENCOUNTER — Telehealth: Payer: Self-pay

## 2023-07-25 ENCOUNTER — Other Ambulatory Visit (HOSPITAL_COMMUNITY): Payer: Self-pay

## 2023-07-25 MED ORDER — NURTEC 75 MG PO TBDP: 75.0000 mg | ORAL_TABLET | ORAL | 6 refills | Status: DC

## 2023-07-25 NOTE — Addendum Note (Signed)
 Addended by: HILLIARD HEATHER CROME on: 07/25/2023 11:46 AM   Modules accepted: Orders

## 2023-07-25 NOTE — Telephone Encounter (Signed)
 She has tried to injectables and had a reaction to it, can we try to submit Nurtec PA again?

## 2023-07-25 NOTE — Telephone Encounter (Signed)
 Pharmacy Patient Advocate Encounter   Received notification from Physician's Office that prior authorization for Nurtec 75MG  dispersible tablets is required/requested.   Insurance verification completed.   The patient is insured through [facility] .   Per test claim: PA required; PA submitted to above mentioned insurance via CoverMyMeds Key/confirmation #/EOC [id number] Status is pending

## 2023-07-25 NOTE — Progress Notes (Signed)
 See procedure note.

## 2023-07-25 NOTE — Telephone Encounter (Signed)
 Patient has tried Emgality  once and also had a reaction to it like the Ajovy  which she tried for 3 months. Please appeal the Nurtec for prevention every other day. This was our most recent preferred treatment option but patient had to try 2 injectables first. Dr Buck added the following addendum to the last office note:

## 2023-07-25 NOTE — Telephone Encounter (Signed)
 Thanks

## 2023-07-25 NOTE — Telephone Encounter (Signed)
 Pharmacy Patient Advocate Encounter  Received notification from [facility] that Prior Authorization for Nurtec 75MG  dispersible tablets has been APPROVED from 07/25/2023 to 10/23/2023   PA #/Case ID/Reference #: PA Case ID #: [id number]

## 2023-07-25 NOTE — Telephone Encounter (Signed)
 Ok great, I let the patient know.

## 2023-07-25 NOTE — Telephone Encounter (Signed)
 See addendum made to note at the very end, encounter from 06/13/2023.

## 2023-07-27 ENCOUNTER — Ambulatory Visit: Payer: Self-pay | Admitting: Neurology

## 2023-07-27 NOTE — Procedures (Signed)
   [facility]  HOME SLEEP TEST (SANSA) REPORT (Mail-Out Device):   STUDY DATE: 07/11/2023  DOB: March 29, 1975  MRN: [id number]  ORDERING CLINICIAN: True Mar, MD, PhD   CLINICAL INFORMATION/HISTORY: 48 year old female with an underlying medical history of asthma, fibroids, overactive bladder, and history of gastritis, who reports recurrent headaches including morning headaches.  BMI (at the time of sleep clinic visit and/or test date): 23 kg/m  FINDINGS:   Study Protocol:    The SANSA single-point-of-skin-contact chest-worn sensor - an FDA cleared and DOT approved type 4 home sleep test device - measures eight physiological channels,  including blood oxygen saturation (measured via PPG [photoplethysmography]), EKG-derived heart rate, respiratory effort, chest movement (measured via accelerometer), snoring, body position, and actigraphy. The device is designed to be worn for up to 10 hours per study.   Sleep Summary:   Total Recording Time (hours, min): 9 hours, 51 min  Total Effective Sleep Time (hours, min):  8 hours, 2 min  Sleep Efficiency (%):    82%   Respiratory Indices:   Calculated sAHI (per hour):  0.1/hour         Oxygen Saturation Statistics:    Oxygen Saturation (%) Mean: 98.3%   Minimum oxygen saturation (%):                 91.2%   O2 Saturation Range (%): 91.2-100%   Time below or at 88% saturation: 0 min   Pulse Rate Statistics:   Pulse Mean (bpm):    79/min    Pulse Range (65-113/min)   Snoring: Mild, intermittent  IMPRESSION/DIAGNOSES:   Primary snoring    RECOMMENDATIONS:   This home sleep test does not demonstrate any significant obstructive or central sleep disordered breathing with a total AHI of less than 5/hour. Her total AHI was 0.1/hour, O2 nadir of 91.2%.  Snoring was detected, intermittently in the mild range. Treatment with a positive airway pressure device such as AutoPap or CPAP is not indicated based on this  test.  Generally speaking, snoring may improve with avoidance of the supine sleep position and weight loss (where clinically appropriate).  For disturbing snoring, an oral appliance through dentistry or orthodontics can be considered.  Other causes of the patient's symptoms, including circadian rhythm disturbances, an underlying mood disorder, medication effect and/or an underlying medical problem cannot be ruled out based on this test. Clinical correlation is recommended.  The patient should be cautioned not to drive, work at heights, or operate dangerous or heavy equipment when tired or sleepy. Review and reiteration of good sleep hygiene measures should be pursued with any patient. The patient will be advised to follow up with her referring provider, who will be notified of the test results.   I certify that I have reviewed the raw data recording prior to the issuance of this report in accordance with the standards of the American Academy of Sleep Medicine (AASM).    INTERPRETING PHYSICIAN:   True Mar, MD, PhD Medical Director, [facility]) Diplomat, ABPN (Neurology and Sleep)   [facility] [address] [place], [place] [address] [phone]

## 2023-07-30 NOTE — Telephone Encounter (Signed)
-----   Message from True Mar sent at 07/27/2023 12:15 PM EDT ----- See MyChart message to patient, FYI.  ----- Message ----- From: Mar True, MD Sent: 07/27/2023  12:14 PM EDT To: True Mar, MD

## 2023-07-30 NOTE — Telephone Encounter (Signed)
 Results of HST given to pt via mychart.  Pt responded she received.

## 2023-08-20 ENCOUNTER — Encounter: Payer: Self-pay | Admitting: Obstetrics and Gynecology

## 2023-08-20 ENCOUNTER — Other Ambulatory Visit: Payer: Self-pay | Admitting: Obstetrics and Gynecology

## 2023-08-20 ENCOUNTER — Other Ambulatory Visit: Payer: Self-pay | Admitting: Neurology

## 2023-08-20 DIAGNOSIS — D251 Intramural leiomyoma of uterus: Secondary | ICD-10-CM

## 2023-08-20 DIAGNOSIS — N920 Excessive and frequent menstruation with regular cycle: Secondary | ICD-10-CM

## 2023-08-20 NOTE — Progress Notes (Signed)
 Request for surgery sent

## 2023-09-10 ENCOUNTER — Other Ambulatory Visit (HOSPITAL_COMMUNITY): Payer: Self-pay

## 2023-09-26 ENCOUNTER — Other Ambulatory Visit (HOSPITAL_COMMUNITY): Payer: Self-pay

## 2023-09-26 ENCOUNTER — Telehealth: Payer: Self-pay | Admitting: Pharmacist

## 2023-09-26 NOTE — Telephone Encounter (Signed)
 Pharmacy Patient Advocate Encounter   Received notification from CoverMyMeds that prior authorization for Nurtec 75MG  dispersible tablets is required/requested.   Insurance verification completed.   The patient is insured through [employer] .   Per test claim: PA required; PA submitted to above mentioned insurance via Latent Key/confirmation #/EOC [id number] Status is pending

## 2023-09-26 NOTE — Telephone Encounter (Signed)
 Pharmacy Patient Advocate Encounter  Received notification from [employer] that Prior Authorization for NURTEC 75 MG PO TBDP has been APPROVED from 09/26/2023 to 09/25/2024   PA #/Case ID/Reference #: [id number]

## 2023-10-31 ENCOUNTER — Telehealth: Payer: Self-pay

## 2023-10-31 NOTE — Telephone Encounter (Signed)
 I reached out to patient regarding her pending surgery w/ Dr. Zina. Dr. Florencio has one available surgery day through December(12/16). Patient states Dr. Cleotilde is suppose to perform her surgery since it's a laparoscopic procedure. Patient states Dr. Zina was not completing the procedure and will reach out to Dr. Dianne office tomorrow.

## 2023-11-12 ENCOUNTER — Encounter (HOSPITAL_BASED_OUTPATIENT_CLINIC_OR_DEPARTMENT_OTHER): Payer: Self-pay | Admitting: Obstetrics & Gynecology

## 2023-11-12 ENCOUNTER — Ambulatory Visit (HOSPITAL_BASED_OUTPATIENT_CLINIC_OR_DEPARTMENT_OTHER): Admitting: Obstetrics & Gynecology

## 2023-11-12 VITALS — BP 133/90 | HR 103 | Ht 64.0 in | Wt 136.4 lb

## 2023-11-12 DIAGNOSIS — D251 Intramural leiomyoma of uterus: Secondary | ICD-10-CM

## 2023-11-12 DIAGNOSIS — N920 Excessive and frequent menstruation with regular cycle: Secondary | ICD-10-CM

## 2023-11-12 NOTE — Progress Notes (Unsigned)
   GYNECOLOGY  VISIT  CC:   No chief complaint on file.   HPI: 48 y.o. 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 Zina to discuss laparoscopic hysterectomy.  No LMP recorded.  Past Medical History:  Diagnosis Date   Asthma 2014   Fibroid    Gastritis    History of bilateral breast implants    summer 2022   Migraine     MEDS:  Reviewed in EPIC  ALLERGIES: Patient has no known allergies.  SH:  ***  ROS  PHYSICAL EXAMINATION:    There were no vitals taken for this visit.    General appearance: alert, cooperative and appears stated age Neck: no adenopathy, supple, symmetrical, trachea midline and thyroid {CHL AMB PHY EX THYROID NORM [phone]::normal to inspection and palpation} CV:  {Exam; heart brief:31539} Lungs:  {pe lungs ob:314451} Breasts: {Exam; breast:13139::normal appearance, no masses or tenderness} Abdomen: soft, non-tender; bowel sounds normal; no masses,  no organomegaly Lymph:  no inguinal LAD noted  Pelvic: External genitalia:  no lesions              Urethra:  normal appearing urethra with no masses, tenderness or lesions              Bartholins and Skenes: normal                 Vagina: {exam; pelvic vaginal:30846}              Cervix: {CHL AMB PHY EX CERVIX NORM DEFAULT:[phone]::no lesions}              Bimanual Exam:  Uterus:  {CHL AMB PHY EX UTERUS NORM [phone]::normal size, contour, position, consistency, mobility, non-tender}              Adnexa: {CHL AMB PHY EX ADNEXA NO MASS DEFAULT:[phone]::no mass, fullness, tenderness}              Rectovaginal: {yes no:314532}.  Confirms.              Anus:  normal sphincter tone, no lesions  Chaperone was present for exam.  Assessment/Plan: There are no diagnoses linked to this encounter.

## 2023-11-12 NOTE — Progress Notes (Incomplete)
   GYNECOLOGY  VISIT  CC:   Discuss possible surgery   HPI: 48 y.o. 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 Zina to discuss surgical treatment of enlarged fibroid uterus.  She is desirous of definitive treatment and would like miniaparoscopic hysterectomy.  No LMP recorded.  Past Medical History:  Diagnosis Date  . Asthma 2014  . Fibroid   . Gastritis   . History of bilateral breast implants    summer 2022  . Migraine     MEDS:  Reviewed in EPIC  ALLERGIES: Patient has no known allergies.  SH:  ***  ROS  PHYSICAL EXAMINATION:    There were no vitals taken for this visit.    General appearance: alert, cooperative and appears stated age Neck: no adenopathy, supple, symmetrical, trachea midline and thyroid {CHL AMB PHY EX THYROID NORM DEFAULT:[phone]::normal to inspection and palpation} CV:  {Exam; heart brief:31539} Lungs:  {pe lungs ob:314451} Breasts: {Exam; breast:13139::normal appearance, no masses or tenderness} Abdomen: soft, non-tender; bowel sounds normal; no masses,  no organomegaly Lymph:  no inguinal LAD noted  Pelvic: External genitalia:  no lesions              Urethra:  normal appearing urethra with no masses, tenderness or lesions              Bartholins and Skenes: normal                 Vagina: {exam; pelvic vaginal:30846}              Cervix: {CHL AMB PHY EX CERVIX NORM DEFAULT:[phone]::no lesions}              Bimanual Exam:  Uterus:  {CHL AMB PHY EX UTERUS NORM DEFAULT:[phone]::normal size, contour, position, consistency, mobility, non-tender}              Adnexa: {CHL AMB PHY EX ADNEXA NO MASS DEFAULT:[id number]::no mass, fullness, tenderness}              Rectovaginal: {yes no:314532}.  Confirms.              Anus:  normal sphincter tone, no lesions  Chaperone was present for exam.  Assessment/Plan: There are no diagnoses linked to this encounter.

## 2023-11-13 LAB — CBC
Hematocrit: 40.2 % (ref 34.0–46.6)
Hemoglobin: 12.9 g/dL (ref 11.1–15.9)
MCH: 29 pg (ref 26.6–33.0)
MCHC: 32.1 g/dL (ref 31.5–35.7)
MCV: 90 fL (ref 79–97)
Platelets: 234 x10E3/uL (ref 150–450)
RBC: 4.45 x10E6/uL (ref 3.77–5.28)
RDW: 12.1 % (ref 11.7–15.4)
WBC: 6.5 x10E3/uL (ref 3.4–10.8)

## 2023-11-13 LAB — IRON,TIBC AND FERRITIN PANEL
Ferritin: 22 ng/mL (ref 15–150)
Iron Saturation: 49 % (ref 15–55)
Iron: 136 ug/dL (ref 27–159)
Total Iron Binding Capacity: 279 ug/dL (ref 250–450)
UIBC: 143 ug/dL (ref 131–425)

## 2023-11-14 ENCOUNTER — Ambulatory Visit (HOSPITAL_BASED_OUTPATIENT_CLINIC_OR_DEPARTMENT_OTHER): Payer: Self-pay | Admitting: Obstetrics & Gynecology

## 2023-11-14 NOTE — Progress Notes (Signed)
   GYNECOLOGY  VISIT  CC:   Discuss possible surgery, fibroid utreus   HPI: 48 y.o. 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 Zina to discuss surgical treatment of enlarged fibroid uterus.  She is desirous of definitive treatment and would like minimally invasive surgery if possible.  Underwent UAE 10/2022 without significant improvement in symptoms.  Ready for more definitive surgery.  MRI showed uterus to be minimally changes with imaging done 05/2023 with uterus measuring 12.6 x 11.1 x 9.9cm.  Multiple fibroids noted.  Does have heavy cycles that are regular.  No recent hb or iron levels.  discussed obtaining today.    She does report bulk symptoms as well including pelvic pressure, bladder pressure and urinary urgency.  Questions answered.    Patient's last menstrual period was 11/03/2023 (exact date).  Past Medical History:  Diagnosis Date   Asthma 2014   Fibroid    Gastritis    History of bilateral breast implants    summer 2022   Migraine     MEDS:  Reviewed in EPIC  ALLERGIES: Patient has no known allergies.  SH:  separated, non smoker  Review of Systems  Constitutional: Negative.   Respiratory: Negative.    Cardiovascular: Negative.   Genitourinary:        Heavy bleeding     PHYSICAL EXAMINATION:    BP (!) 133/90 (BP Location: Right Arm, Patient Position: Sitting, Cuff Size: Normal)   Pulse (!) 103   Ht 5' 4 (1.626 m)   Wt 136 lb 6.4 oz (61.9 kg)   LMP 11/03/2023 (Exact Date)   SpO2 100%   BMI 23.41 kg/m     General appearance: alert, cooperative and appears stated age Abdomen: soft, non-tender; bowel sounds normal; Uterus about 14 - 16 weeks size Lymph:  no inguinal LAD noted  Pelvic: External genitalia:  no lesions              Urethra:  normal appearing urethra with no masses, tenderness or lesions              Bartholins and Skenes: normal                 Vagina: normal mucosa without prolapse or  lesions              Cervix: no lesions              Bimanual Exam:  Uterus:  enlarged, 14 -16 weeks size, globular but mobile              Adnexa: no mass, fullness, tenderness              Anus:  normal sphincter tone, no lesions  Chaperone was present for exam.  Assessment/Plan: 1. Fibroids, intramural (Primary) - h/o failed UAE.  Pt desirous of definitive treatment at this time.  Would like laparoscopic surgery if possible.  Will proceed with surgical planning.  Procedure, incision locations, risk reviewed.  Pt comfortable with plan.    2. Menorrhagia with regular cycle - CBC - Iron, TIBC and Ferritin Panel - Iron - Ferritin

## 2023-11-28 ENCOUNTER — Ambulatory Visit: Admitting: Adult Health

## 2023-11-28 ENCOUNTER — Encounter: Payer: Self-pay | Admitting: Adult Health

## 2023-11-28 ENCOUNTER — Telehealth: Payer: Self-pay | Admitting: Pharmacist

## 2023-11-28 VITALS — BP 133/86 | HR 88 | Ht 65.0 in | Wt 135.0 lb

## 2023-11-28 DIAGNOSIS — G43019 Migraine without aura, intractable, without status migrainosus: Secondary | ICD-10-CM | POA: Diagnosis not present

## 2023-11-28 MED ORDER — NURTEC 75 MG PO TBDP: ORAL_TABLET | ORAL | 12 refills | Status: AC

## 2023-11-28 MED ORDER — QULIPTA 60 MG PO TABS: 60.0000 mg | ORAL_TABLET | Freq: Every day | ORAL | 12 refills | Status: AC

## 2023-11-28 NOTE — Progress Notes (Signed)
 PATIENT: Catherine Estrada DOB: 12-20-1975  REASON FOR VISIT: follow up HISTORY FROM: patient PRIMARY NEUROLOGIST:   Chief Complaint  Patient presents with   Headache    Rm 4 alone  Pt is well, reports she is having ongoing daily headaches. Intensity of headache varies. Nurtec does help but headache returns next day.      HISTORY OF PRESENT ILLNESS: Today 11/28/23   Catherine Estrada is a 48 y.o. female who has been followed in this office for Migraine headaches. Returns today for follow-up.  She states that she continues to wake up with headaches.  Typically wakes up around 5 AM with a headache.  She states typically she can get up and drink coffee and if it is related take Nurtec she takes that and the headache will resolve.  On occasion if the headache does not resolve she will have to use Imitrex.  She is currently taking Nurtec every other day for prevention.  In the past she has tried Ajovy  and Emgality  but it caused injection reactions.  She had a home sleep test that did not show significant apnea.  Last imaging was completed in February which was a CT scan that was relatively unremarkable.  She returns today for an evaluation.   Location: frontal region  Frequency: daily headache Duration:  Aura: no Photophonia:yes  Phonophobia:yes  Nausea: yes  Vomiting: yes  Numbness: no Weakness: no Visual changes:no    HISTORY (copied from Dr. Obie note) 06/13/2023: She reports no significant improvement in her migraines.  Her migraines are worse before her menstrual cycle.  She has been on tranexamic acid  tablets to reduce the severity of her bleeding.  She had an embolization last year but it did not really help.  She may be looking at needing a hysterectomy next.  She has heavy bleeding and painful fibroids and has been working closely with her GYN.  She has not made a follow-up appointment yet.  She would like to try Nurtec.  She is limiting her Maxalt  but would be willing to  increase the dose.  She has limited her Excedrin  and has used it about 3 times this past month.  She limits her caffeine  to 1 cup of coffee in the morning and tries to hydrate well but admits that she hydrates better after she comes home from work.  Her fibroids make her urinary urgency worse when she hydrates better.  She does not have unlimited access to a bathroom though during her workday.  She has new eyeglasses.  She would be willing to pursue a home sleep test.  She wonders if her oxygen drops in her sleep.  Sometimes when she wakes up with a headache she has to sit up and lying down makes it worse. Ajovy  injections caused injection site reaction, the third time she had blisters and a rash and itching.  She did not call our office unfortunately.  She has not taken her fourth dose.  REVIEW OF SYSTEMS: Out of a complete 14 system review of symptoms, the patient complains only of the following symptoms, and all other reviewed systems are negative.   Listed in HPI  ALLERGIES: No Known Allergies  HOME MEDICATIONS: Outpatient Medications Prior to Visit  Medication Sig Dispense Refill   albuterol  (PROVENTIL  HFA;VENTOLIN  HFA) 108 (90 Base) MCG/ACT inhaler Inhale 2 puffs into the lungs every 6 (six) hours as needed for wheezing or shortness of breath.     ascorbic acid (VITAMIN C) 500 MG  tablet Take 500 mg by mouth daily.     aspirin -acetaminophen -caffeine  (EXCEDRIN  MIGRAINE) 250-250-65 MG tablet Take 1 tablet by mouth every 6 (six) hours as needed for headache.     Multiple Vitamin (MULTIVITAMIN) tablet Take 1 tablet by mouth daily.     OVER THE COUNTER MEDICATION NUTRAFOL take 1 tablet daily     rizatriptan  (MAXALT ) 10 MG tablet Take 1 tablet (10 mg total) by mouth as needed for migraine. May repeat in 2 h prn, no more than 2 pills/24 h, no more than 3 pills/week. 10 tablet 5   tranexamic acid  (LYSTEDA ) 650 MG TABS tablet Take 2 tablets (1,300 mg total) by mouth 3 (three) times daily. Take during  menses for a maximum of five days 30 tablet 8   Rimegepant Sulfate (NURTEC) 75 MG TBDP DISSOLVE 1 TABLET BY MOUTH EVERY OTHER DAY 16 tablet 5   No facility-administered medications prior to visit.    PAST MEDICAL HISTORY: Past Medical History:  Diagnosis Date   Asthma 2014   Fibroid    Gastritis    History of bilateral breast implants    summer 2022   Migraine     PAST SURGICAL HISTORY: Past Surgical History:  Procedure Laterality Date   IR ANGIOGRAM PELVIS SELECTIVE OR SUPRASELECTIVE  11/10/2022   IR ANGIOGRAM SELECTIVE EACH ADDITIONAL VESSEL  11/10/2022   IR EMBO TUMOR ORGAN ISCHEMIA INFARCT INC GUIDE ROADMAPPING  11/10/2022   IR US  GUIDE VASC ACCESS RIGHT  11/10/2022   VAGINOPLASTY  2012   had vaginal tightening after last delivery    FAMILY HISTORY: Family History  Problem Relation Age of Onset   Hypertension Mother    Hypertension Father    Heart block Sister    Breast cancer Maternal Aunt    Migraines Paternal Aunt    Breast cancer Paternal Aunt 75   Heart block Maternal Grandmother    Heart block Paternal Grandmother     SOCIAL HISTORY: Social History   Socioeconomic History   Marital status: Legally Separated    Spouse name: Not on file   Number of children: 2   Years of education: Not on file   Highest education level: Bachelor's degree (e.g., BA, AB, BS)  Occupational History   Not on file  Tobacco Use   Smoking status: Never   Smokeless tobacco: Never  Vaping Use   Vaping status: Never Used  Substance and Sexual Activity   Alcohol use: Yes    Comment: Rare   Drug use: No   Sexual activity: Yes    Partners: Male    Birth control/protection: None  Other Topics Concern   Not on file  Social History Narrative   Caffiene 1 cup coffee daily   Works interpreter, (daytime hrs)   Live 2 kids. 1 dog.     Social Drivers of Corporate Investment Banker Strain: Low Risk  (03/15/2023)   Overall Financial Resource Strain (CARDIA)    Difficulty of  Paying Living Expenses: Not very hard  Food Insecurity: No Food Insecurity (03/15/2023)   Hunger Vital Sign    Worried About Running Out of Food in the Last Year: Never true    Ran Out of Food in the Last Year: Never true  Transportation Needs: No Transportation Needs (03/15/2023)   PRAPARE - Administrator, Civil Service (Medical): No    Lack of Transportation (Non-Medical): No  Physical Activity: Unknown (03/15/2023)   Exercise Vital Sign    Days of Exercise  per Week: 0 days    Minutes of Exercise per Session: Not on file  Stress: No Stress Concern Present (03/15/2023)   [employer] of Occupational Health - Occupational Stress Questionnaire    Feeling of Stress : Not at all  Social Connections: Moderately Isolated (03/15/2023)   Social Connection and Isolation Panel    Frequency of Communication with Friends and Family: More than three times a week    Frequency of Social Gatherings with Friends and Family: Once a week    Attends Religious Services: 1 to 4 times per year    Active Member of [employer] or Organizations: No    Attends Engineer, Structural: Not on file    Marital Status: Separated  Intimate Partner Violence: Unknown (02/22/2022)   Received from [facility]   HITS    Physically Hurt: Not on file    Insult or Talk Down To: Not on file    Threaten Physical Harm: Not on file    Scream or Curse: Not on file      PHYSICAL EXAM  Vitals:   11/28/23 1023  BP: 133/86  Pulse: 88  Weight: 135 lb (61.2 kg)  Height: 5' 5 (1.651 m)   Body mass index is 22.47 kg/m.  Generalized: Well developed, in no acute distress   Neurological examination  Mentation: Alert oriented to time, place, history taking. Follows all commands speech and language fluent Cranial nerve II-XII: Pupils were equal round reactive to light. Extraocular movements were full, visual field were full on confrontational test. Facial sensation and strength were normal. Uvula tongue  midline. Head turning and shoulder shrug  were normal and symmetric. Motor: The motor testing reveals 5 over 5 strength of all 4 extremities. Good symmetric motor tone is noted throughout.  Sensory: Sensory testing is intact to soft touch on all 4 extremities. No evidence of extinction is noted.  Coordination: Cerebellar testing reveals good finger-nose-finger and heel-to-shin bilaterally.  Gait and station: Gait is normal.  Reflexes: Deep tendon reflexes are symmetric and normal bilaterally.   DIAGNOSTIC DATA (LABS, IMAGING, TESTING) - I reviewed patient records, labs, notes, testing and imaging myself where available.  Lab Results  Component Value Date   WBC 6.5 11/12/2023   HGB 12.9 11/12/2023   HCT 40.2 11/12/2023   MCV 90 11/12/2023   PLT 234 11/12/2023      Component Value Date/Time   NA 138 03/15/2023 1438   K 5.0 03/15/2023 1438   CL 101 03/15/2023 1438   CO2 23 03/15/2023 1438   GLUCOSE 95 03/15/2023 1438   GLUCOSE 141 (H) 03/02/2023 1333   BUN 9 03/15/2023 1438   CREATININE 0.66 03/15/2023 1438   CALCIUM  9.2 03/15/2023 1438   PROT 6.9 03/15/2023 1438   ALBUMIN 4.5 03/15/2023 1438   AST 17 03/15/2023 1438   ALT 14 03/15/2023 1438   ALKPHOS 75 03/15/2023 1438   BILITOT <0.2 03/15/2023 1438   GFRNONAA >60 03/02/2023 1333   GFRAA >60 11/29/2015 1056   Lab Results  Component Value Date   CHOL 226 (H) 03/15/2023   HDL 61 03/15/2023   LDLCALC 137 (H) 03/15/2023   TRIG 156 (H) 03/15/2023   CHOLHDL 3.7 03/15/2023   Lab Results  Component Value Date   HGBA1C 5.4 03/15/2023     ASSESSMENT AND PLAN 48 y.o. year old female  has a past medical history of Asthma (2014), Fibroid, Gastritis, History of bilateral breast implants, and Migraine. here with:  Migraine headaches  Start  Qulipta 60 mg daily Stop Nurtec for prevention.  Now can take for abortive.  Advised that she can take 1 tablet at the onset of a migraine.  Only 1 tablet in 24 hours. Continue rizatriptan   for abortive therapy MRI of the brain with and without contrast due to ongoing daily headaches Follow-up in 6 months or sooner if needed  Meds ordered this encounter  Medications   Atogepant (QULIPTA) 60 MG TABS    Sig: Take 1 tablet (60 mg total) by mouth daily.    Dispense:  30 tablet    Refill:  12    Supervising Provider:   YAN, YIJUN [[id number]]   Rimegepant Sulfate (NURTEC) 75 MG TBDP    Sig: Take 1 tablet of nurtec at the onset of migraine. Only 1 tablet in 24 hours    Dispense:  8 tablet    Refill:  12    Supervising Provider:   YAN, YIJUN [phone]   Orders Placed This Encounter  Procedures   MR BRAIN W WO CONTRAST     Duwaine Russell, MSN, NP-C 11/28/2023, 11:06 AM [facility] [address] [place], [place] [address] [phone]  The patient's condition requires frequent monitoring and adjustments in the treatment plan, reflecting the ongoing complexity of care.  This provider is the continuing focal point for all needed services for this condition.

## 2023-11-28 NOTE — Patient Instructions (Signed)
 Your Plan:  Start Qulipta 60 mg daily  Take Nurtec 75 mg at the onset migraine. Only 1 tablet in 24 hours Continue Maxalt  as needed for abortive therapy MRI brain  If your symptoms worsen or you develop new symptoms please let us  know.   Thank you for coming to see us  at [facility]. I hope we have been able to provide you high quality care today.  You may receive a patient satisfaction survey over the next few weeks. We would appreciate your feedback and comments so that we may continue to improve ourselves and the health of our patients.

## 2023-11-28 NOTE — Telephone Encounter (Signed)
 Pharmacy Patient Advocate Encounter  Received notification from [employer] that Prior Authorization for QULIPTA 60 MG PO TABS has been APPROVED from 11/28/2023 to 02/26/2024   PA #/Case ID/Reference #: [id number]

## 2023-11-28 NOTE — Telephone Encounter (Signed)
 Pharmacy Patient Advocate Encounter   Received notification from Patient Pharmacy that prior authorization for Qulipta 60MG  tablets is required/requested.   Insurance verification completed.   The patient is insured through [employer].   Per test claim: PA required; PA submitted to above mentioned insurance via Latent Key/confirmation #/EOC [id number] Status is pending

## 2023-12-03 ENCOUNTER — Telehealth: Payer: Self-pay | Admitting: Adult Health

## 2023-12-03 NOTE — Telephone Encounter (Signed)
 [employer] shara: [phone] exp. 12/03/23-03/01/24 sent to [facility] for an open MRI. [phone]

## 2023-12-07 ENCOUNTER — Other Ambulatory Visit (HOSPITAL_BASED_OUTPATIENT_CLINIC_OR_DEPARTMENT_OTHER): Payer: Self-pay | Admitting: Obstetrics & Gynecology

## 2023-12-07 DIAGNOSIS — D251 Intramural leiomyoma of uterus: Secondary | ICD-10-CM

## 2023-12-07 DIAGNOSIS — N920 Excessive and frequent menstruation with regular cycle: Secondary | ICD-10-CM

## 2023-12-19 ENCOUNTER — Encounter: Payer: Self-pay | Admitting: Adult Health

## 2023-12-21 ENCOUNTER — Other Ambulatory Visit (HOSPITAL_COMMUNITY): Payer: Self-pay

## 2023-12-21 ENCOUNTER — Telehealth: Payer: Self-pay

## 2023-12-21 NOTE — Telephone Encounter (Signed)
 Called [facility] and they were able to process the RX-they are getting ready for the pt-sent PT a MC to let her know.

## 2023-12-21 NOTE — Telephone Encounter (Signed)
     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 I will outreach [facility] after they open today to ask them to handle this. This is not something that I can do for the PT.

## 2024-01-01 ENCOUNTER — Encounter (INDEPENDENT_AMBULATORY_CARE_PROVIDER_SITE_OTHER): Payer: Self-pay

## 2024-01-08 ENCOUNTER — Other Ambulatory Visit (HOSPITAL_BASED_OUTPATIENT_CLINIC_OR_DEPARTMENT_OTHER): Payer: Self-pay | Admitting: Obstetrics & Gynecology

## 2024-01-08 DIAGNOSIS — Z01818 Encounter for other preprocedural examination: Secondary | ICD-10-CM

## 2024-01-08 DIAGNOSIS — N920 Excessive and frequent menstruation with regular cycle: Secondary | ICD-10-CM

## 2024-01-08 DIAGNOSIS — D251 Intramural leiomyoma of uterus: Secondary | ICD-10-CM

## 2024-01-19 ENCOUNTER — Other Ambulatory Visit: Payer: Self-pay | Admitting: Obstetrics and Gynecology

## 2024-01-19 DIAGNOSIS — N920 Excessive and frequent menstruation with regular cycle: Secondary | ICD-10-CM

## 2024-01-23 ENCOUNTER — Encounter: Payer: Self-pay | Admitting: Adult Health

## 2024-01-23 ENCOUNTER — Encounter (HOSPITAL_BASED_OUTPATIENT_CLINIC_OR_DEPARTMENT_OTHER): Payer: Self-pay

## 2024-01-23 DIAGNOSIS — G43019 Migraine without aura, intractable, without status migrainosus: Secondary | ICD-10-CM

## 2024-01-24 NOTE — Telephone Encounter (Signed)
 Resent this order today-PA is still good.

## 2024-01-31 ENCOUNTER — Other Ambulatory Visit: Payer: Self-pay | Admitting: Adult Health

## 2024-01-31 MED ORDER — ALPRAZOLAM 0.5 MG PO TABS: ORAL_TABLET | ORAL | 0 refills | Status: AC

## 2024-01-31 NOTE — Addendum Note (Signed)
 Addended by: SHERRYL DUWAINE SQUIBB on: 01/31/2024 10:18 AM   Modules accepted: Orders

## 2024-02-18 ENCOUNTER — Ambulatory Visit: Admitting: Adult Health

## 2024-02-20 ENCOUNTER — Other Ambulatory Visit (HOSPITAL_COMMUNITY): Payer: Self-pay

## 2024-02-20 ENCOUNTER — Telehealth: Payer: Self-pay | Admitting: Adult Health

## 2024-02-20 ENCOUNTER — Telehealth: Payer: Self-pay | Admitting: *Deleted

## 2024-02-20 MED ORDER — ONABOTULINUMTOXINA 200 UNITS IJ SOLR
INTRAMUSCULAR | 2 refills | Status: AC
  Filled 2024-02-20: qty 1, fill #0
  Filled 2024-02-21: qty 1, 90d supply, fill #0

## 2024-02-20 NOTE — Telephone Encounter (Signed)
 Megan sent me a message to see if insurance would approve pt for Botox  for migraines. I submitted PA via CMM and auth was approved. Called pt and scheduled for 2/11, as long as pt answers when [facility] calls tomorrow they should be able to get it here. I made pt aware they would be calling.  Auth#: [id number] (02/20/24-08/18/24)

## 2024-02-20 NOTE — Telephone Encounter (Signed)
 SABRA

## 2024-02-20 NOTE — Telephone Encounter (Signed)
 She was approved, please send rx to [facility].

## 2024-02-20 NOTE — Addendum Note (Signed)
 Addended by: SHONA SAVANT A on: 02/20/2024 05:04 PM   Modules accepted: Orders

## 2024-02-21 ENCOUNTER — Other Ambulatory Visit: Payer: Self-pay

## 2024-02-21 ENCOUNTER — Other Ambulatory Visit: Payer: Self-pay | Admitting: Pharmacy Technician

## 2024-02-21 ENCOUNTER — Encounter (HOSPITAL_BASED_OUTPATIENT_CLINIC_OR_DEPARTMENT_OTHER): Payer: Self-pay | Admitting: Obstetrics & Gynecology

## 2024-02-21 NOTE — Progress Notes (Signed)
 CC info given to Shavod

## 2024-02-21 NOTE — Progress Notes (Signed)
 Specialty Pharmacy Initial Fill Coordination Note  Catherine Estrada is a 49 y.o. female contacted today regarding initial fill of specialty medication(s) OnabotulinumtoxinA  (BOTOX )   Patient requested Courier to Provider Office   Delivery date: 02/26/24   Verified address: [facility]  [address], [address] [place], [place] [address]   Medication will be filled on 2.9.26.   Patient is aware of 4 copayment.

## 2024-02-27 ENCOUNTER — Ambulatory Visit: Admitting: Adult Health

## 2024-03-04 ENCOUNTER — Ambulatory Visit (HOSPITAL_COMMUNITY): Admit: 2024-03-04 | Admitting: Obstetrics & Gynecology

## 2024-09-03 ENCOUNTER — Ambulatory Visit: Admitting: Adult Health
